# Patient Record
Sex: Male | Born: 1950 | Race: White | Hispanic: No | Marital: Married | Smoking: Current every day smoker
Health system: Southern US, Community
[De-identification: ages and names within clinical notes are randomized; demographics above are authoritative.]

## PROBLEM LIST (undated history)

## (undated) DIAGNOSIS — I739 Peripheral vascular disease, unspecified: Secondary | ICD-10-CM

## (undated) DIAGNOSIS — I10 Essential (primary) hypertension: Secondary | ICD-10-CM

## (undated) DIAGNOSIS — I2510 Atherosclerotic heart disease of native coronary artery without angina pectoris: Secondary | ICD-10-CM

## (undated) DIAGNOSIS — R609 Edema, unspecified: Secondary | ICD-10-CM

## (undated) HISTORY — PX: SPINAL FUSION: SHX223

## (undated) HISTORY — PX: BACK SURGERY: SHX140

## (undated) HISTORY — PX: STENT PLACEMENT VASCULAR (ARMC HX): HXRAD1737

## (undated) HISTORY — PX: CHOLECYSTECTOMY: SHX55

---

## 2004-05-24 ENCOUNTER — Ambulatory Visit: Payer: Self-pay | Admitting: Otolaryngology

## 2005-02-23 ENCOUNTER — Inpatient Hospital Stay: Payer: Self-pay | Admitting: Cardiovascular Disease

## 2005-02-23 ENCOUNTER — Other Ambulatory Visit: Payer: Self-pay

## 2005-02-25 ENCOUNTER — Inpatient Hospital Stay (HOSPITAL_COMMUNITY): Admission: AD | Admit: 2005-02-25 | Discharge: 2005-02-26 | Payer: Self-pay | Admitting: Cardiology

## 2005-03-15 ENCOUNTER — Encounter: Payer: Self-pay | Admitting: Cardiovascular Disease

## 2005-04-15 ENCOUNTER — Encounter: Payer: Self-pay | Admitting: Cardiovascular Disease

## 2005-05-15 ENCOUNTER — Encounter: Payer: Self-pay | Admitting: Cardiovascular Disease

## 2005-06-15 ENCOUNTER — Encounter: Payer: Self-pay | Admitting: Cardiovascular Disease

## 2006-04-19 ENCOUNTER — Observation Stay: Payer: Self-pay | Admitting: Cardiology

## 2006-04-19 ENCOUNTER — Other Ambulatory Visit: Payer: Self-pay

## 2006-04-20 ENCOUNTER — Other Ambulatory Visit: Payer: Self-pay

## 2008-03-03 ENCOUNTER — Ambulatory Visit: Payer: Self-pay | Admitting: Internal Medicine

## 2009-08-31 ENCOUNTER — Ambulatory Visit: Payer: Self-pay | Admitting: Family Medicine

## 2010-07-16 ENCOUNTER — Ambulatory Visit: Payer: Self-pay | Admitting: Otolaryngology

## 2013-05-15 ENCOUNTER — Ambulatory Visit: Payer: Self-pay | Admitting: Internal Medicine

## 2013-06-14 ENCOUNTER — Ambulatory Visit: Payer: Self-pay | Admitting: Urology

## 2014-12-30 IMAGING — US US RENAL KIDNEY
1 series · 13 of 23 positions shown · non-contrast
Comparison: none

REASON FOR EXAM: Back pain Hematuria
COMMENTS:

PROCEDURE:     ANGEL BOLIVAR - ANGEL BOLIVAR KIDNEYS  - May 15, 2013  [DATE]
RESULT:

[Series 1: us renal kidney · 0.28mm/px · 13 of 23 slices shown]
[im 1/23]
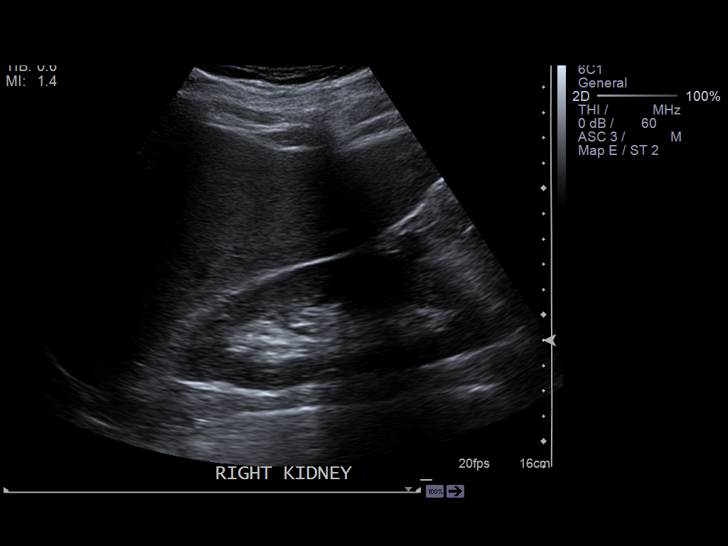
[im 3/23]
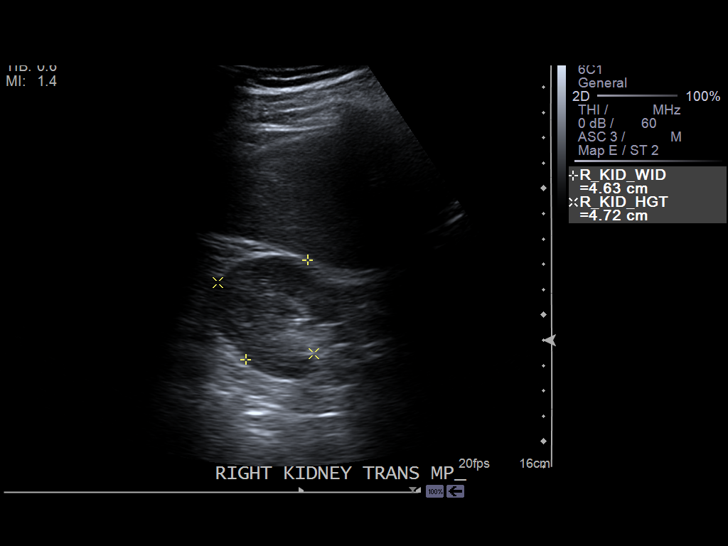
[im 5/23]
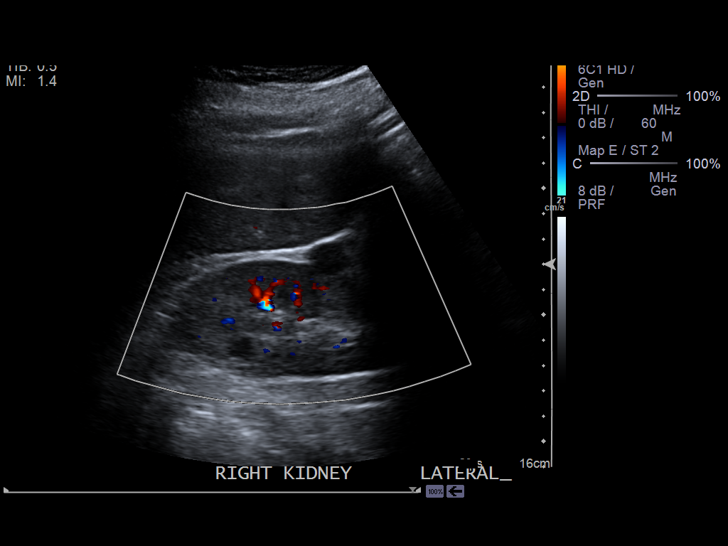
[im 7/23]
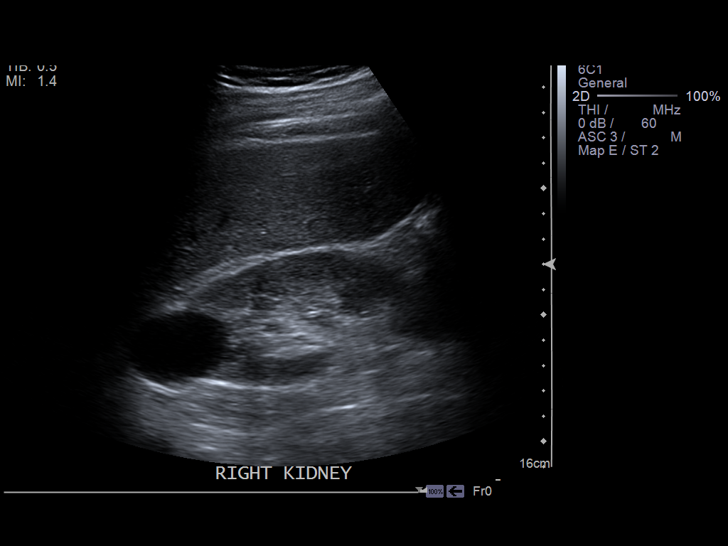
[im 8/23]
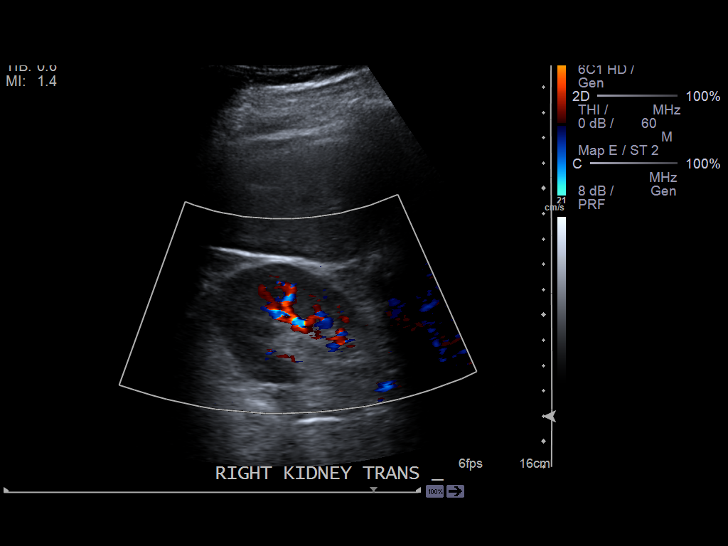
[im 10/23]
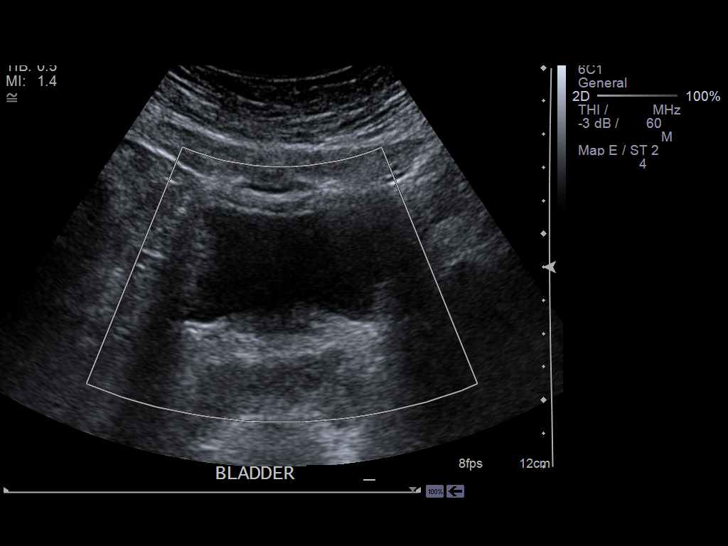
[im 12/23]
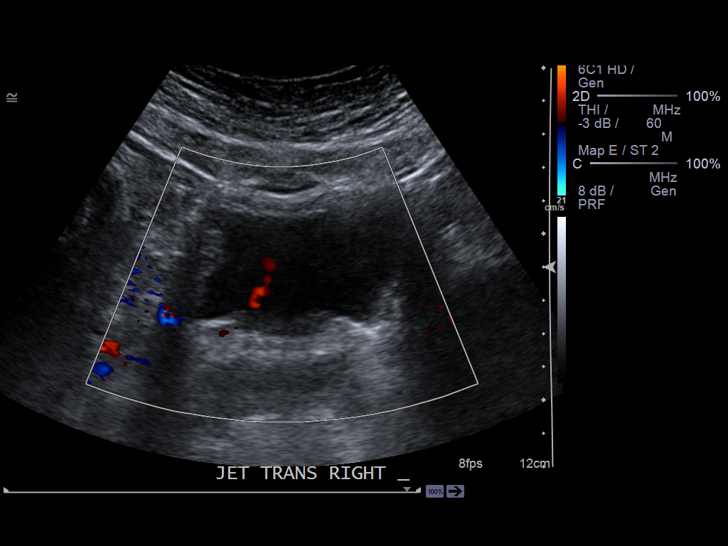
[im 14/23]
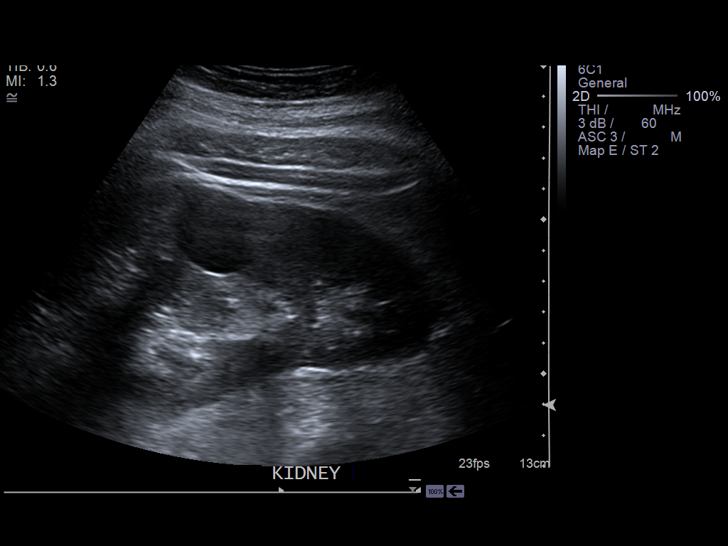
[im 16/23]
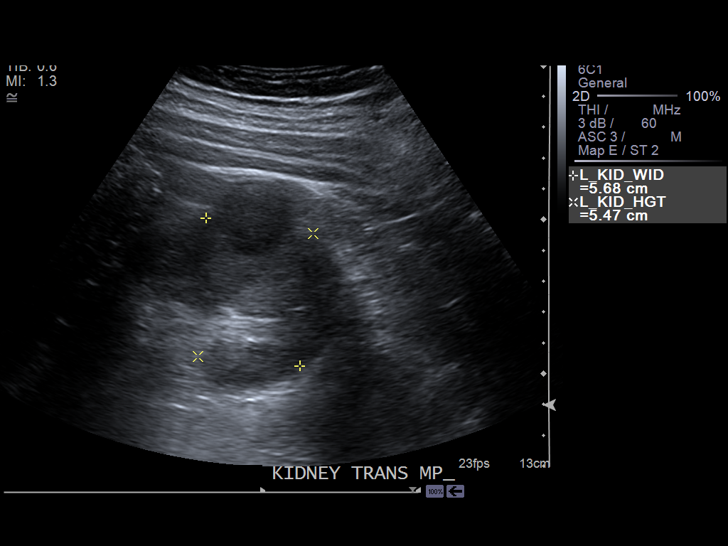
[im 17/23]
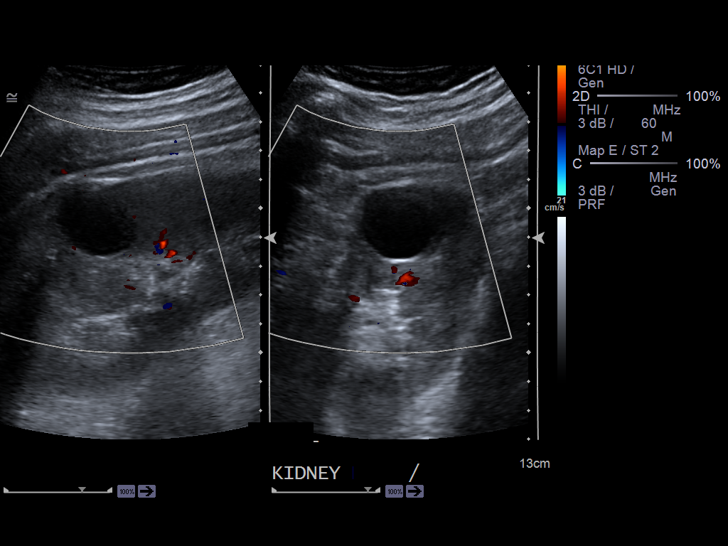
[im 19/23]
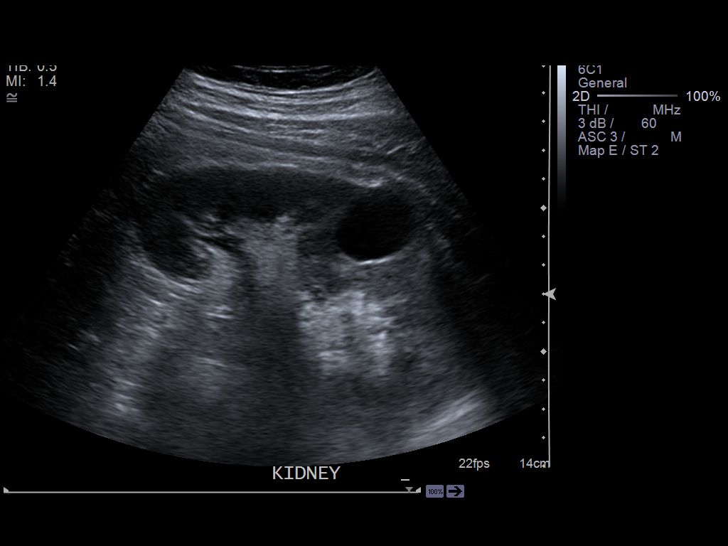
[im 21/23]
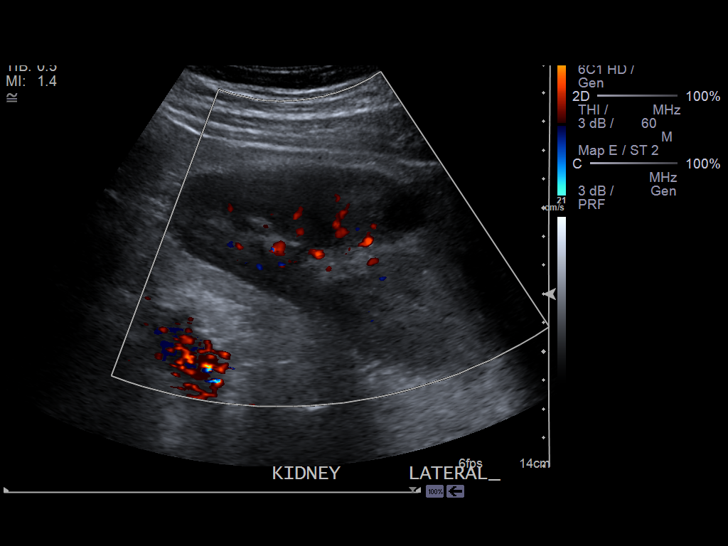
[im 23/23]
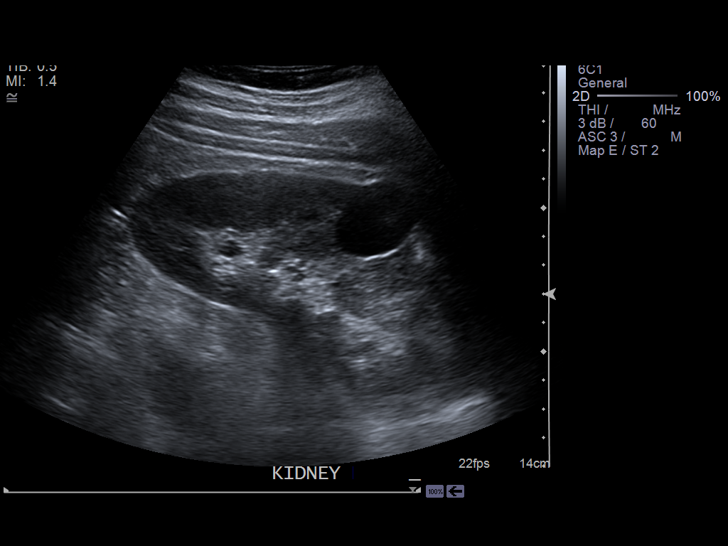

[13 of 23 positions shown; findings below may reference images not displayed]

FINDINGS: The right kidney measures 13.2 x 4.6 x 4.7 cm and the left 12.2 x
5.7 x 5.5 cm.

Evaluation of the right kidney demonstrates appropriate corticomedullary
differentiation without sonographic evidence of calculi. Three simple
appearing cysts are identified within the left kidney. The largest is in the
upper pole and appears to be within the cortex measuring 3.8 x 3.1 x 3.1 cm.
A midpole, exophytic cyst is identified measuring 1.3 x 1.5 x 1.1 cm. An
upper pole cortical cyst is identified measuring 1.2 x 1.3 x 1.1 cm. These
areas demonstrate increased through transmission, anechoic architecture and
an imperceptible wall.

Evaluation of the left kidney demonstrates appropriate corticomedullary
differentiation without evidence of calculi or hydronephrosis. A hypoechoic
mass is identified within the midpole region measuring 2.4 x 2.6 x 2.8 cm.
There is a component of increased through transmission and primary
imperceptible wall. There is no evidence of vascularity and this likely
represents a cyst. There are images which demonstrate this area as
hypoechoic and thus are indeterminate. Considering the size of this finding,
further evaluation with triphasic CT and/or MRI is recommended. Bilateral
ureteral jets are appreciated.
IMPRESSION: 1.  Simple appearing cysts within the right kidney.
2.  Likely cyst within the left kidney though the finding is indeterminate
and further evaluation recommended as described above.

## 2015-06-27 IMAGING — CT CT ABDOMEN AND PELVIS WITHOUT AND WITH CONTRAST
2 of 4 series · 14 of 32 positions shown, 19 images · non-contrast
Comparison: none

REASON FOR EXAM: Hematuria
COMMENTS:

PROCEDURE:     KCT - KCT ABDOMEN/PELVIS W/WO  - June 14, 2013  [DATE]
RESULT:     History: Hematuria.
Comparison Study: Renal ultrasound 05/15/2013.

[Series 2: wo 3.0 i40f 3 · axial · 0.83mm/px · z∈[-1141,-772]mm · 8 of 159 slices shown, 13 images]
[im 18/159  soft-tissue]
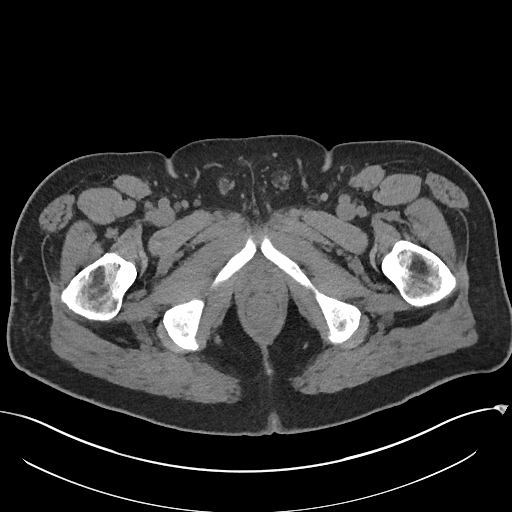
[im 18/159  bone]
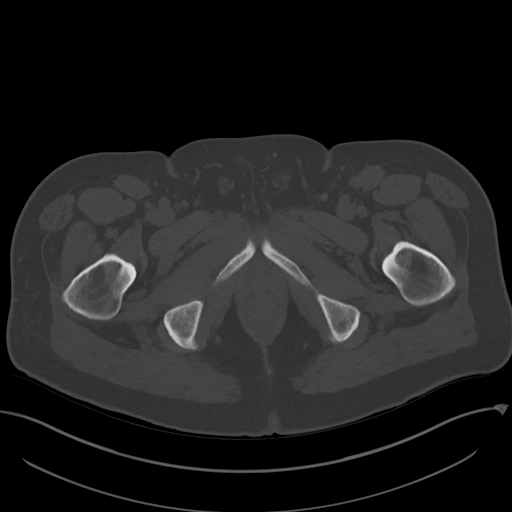
[im 36/159  soft-tissue]
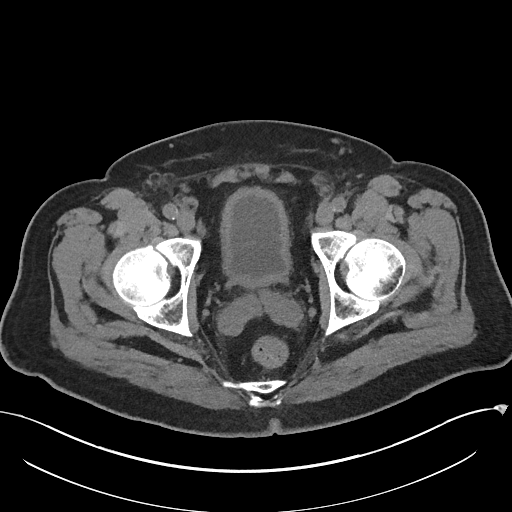
[im 53/159  soft-tissue]
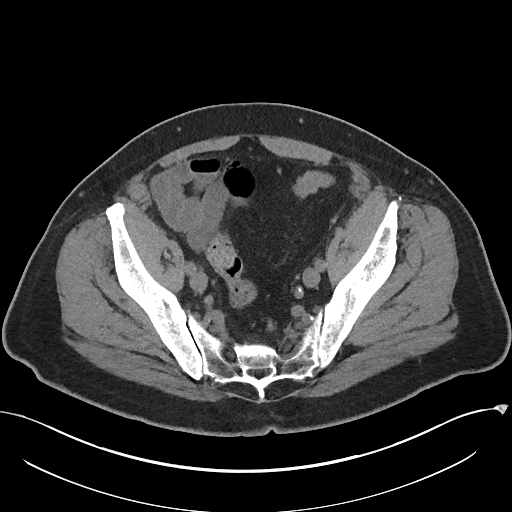
[im 71/159  soft-tissue]
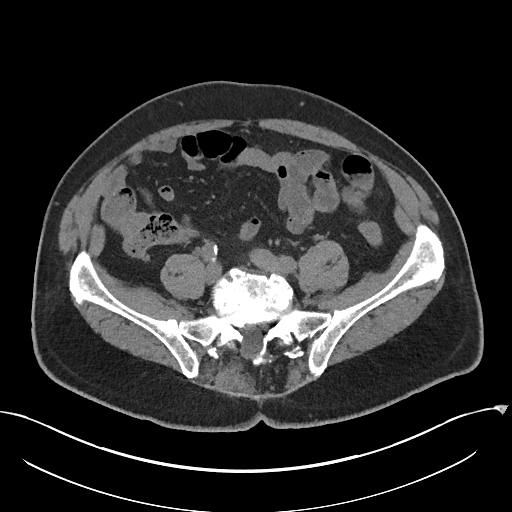
[im 88/159  soft-tissue]
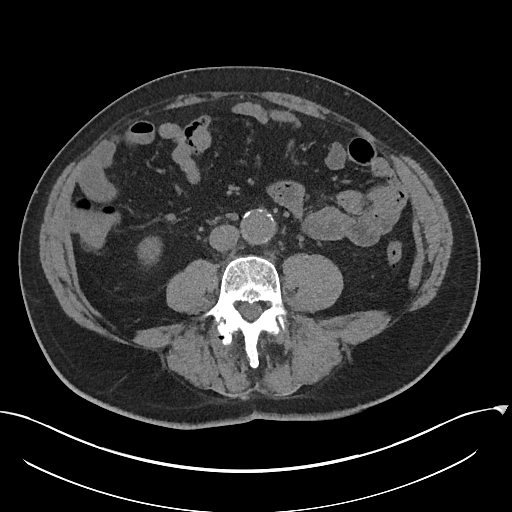
[im 88/159  lung]
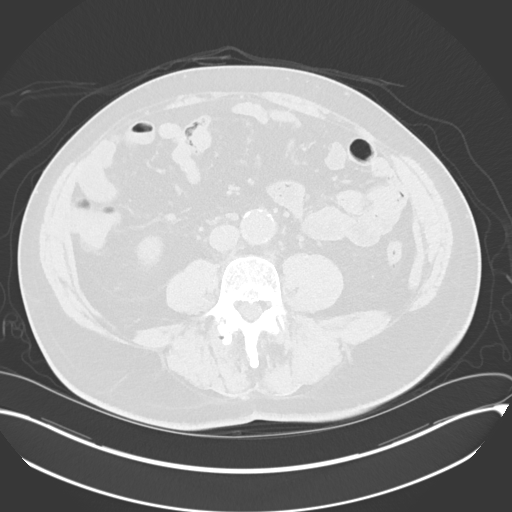
[im 106/159  soft-tissue]
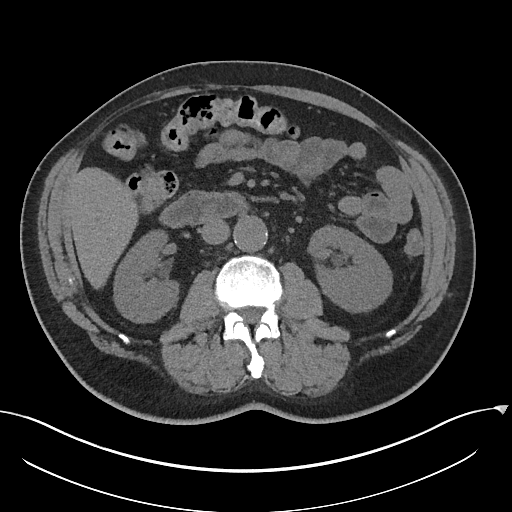
[im 106/159  lung]
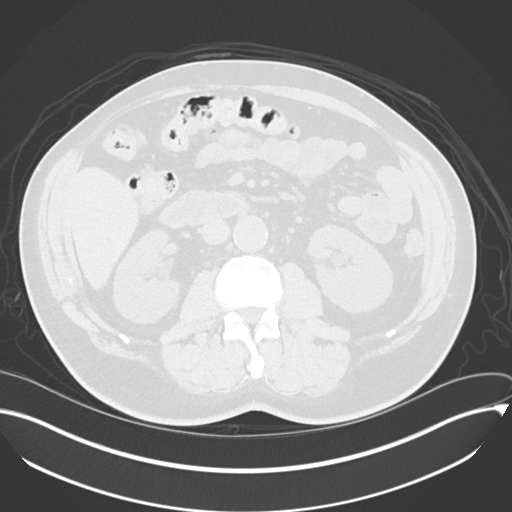
[im 123/159  soft-tissue]
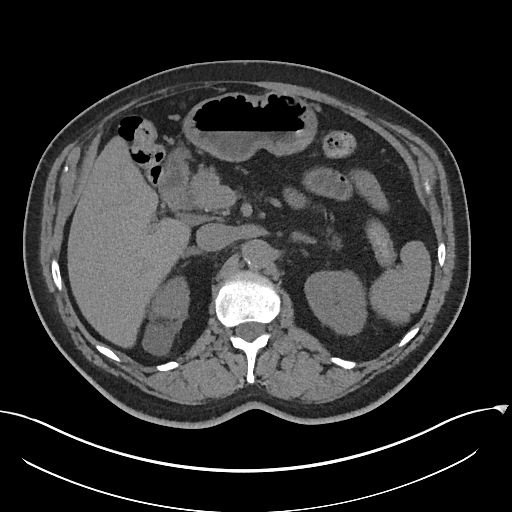
[im 123/159  lung]
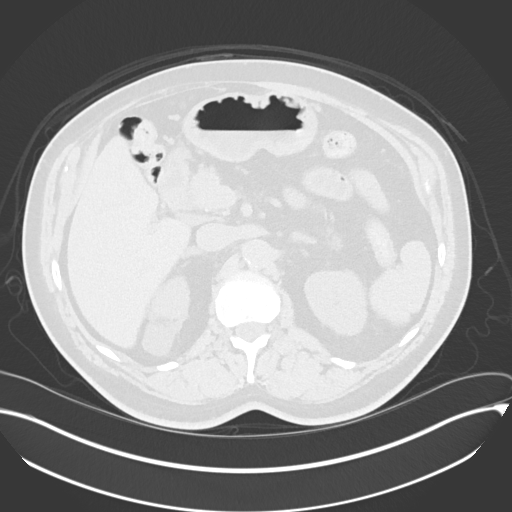
[im 141/159  soft-tissue]
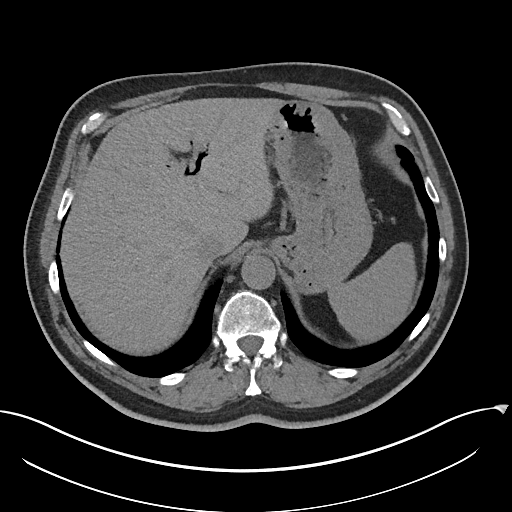
[im 141/159  lung]
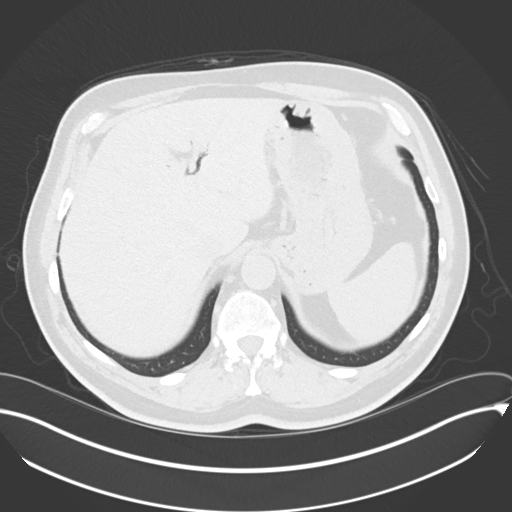

[Series 4: with 3.0 i40f 3 · axial · 0.83mm/px · z∈[-1135,-838]mm · 6 of 159 slices shown]
[im 20/159  soft-tissue]
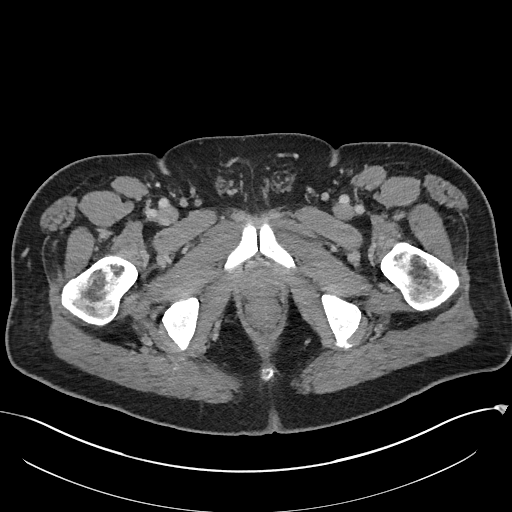
[im 40/159  soft-tissue]
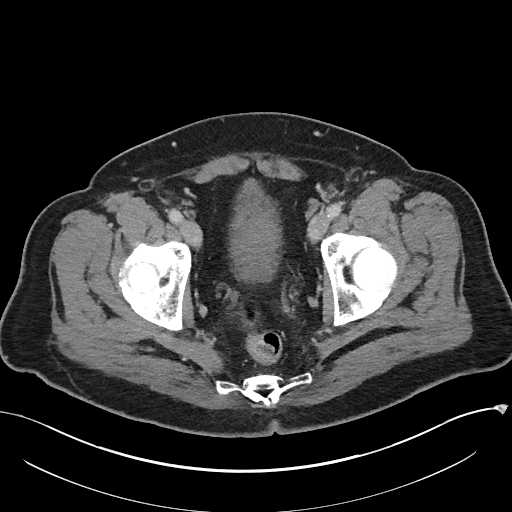
[im 60/159  soft-tissue]
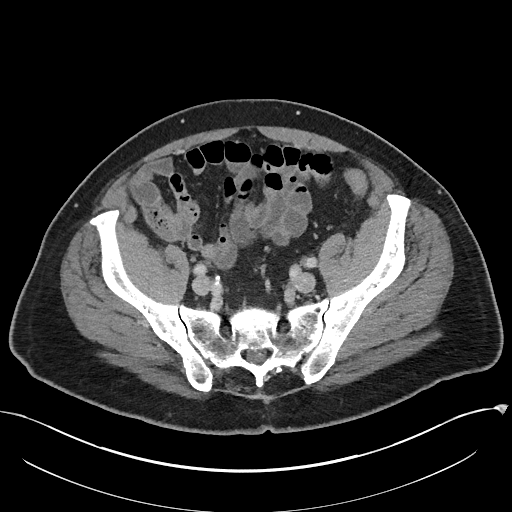
[im 80/159  soft-tissue]
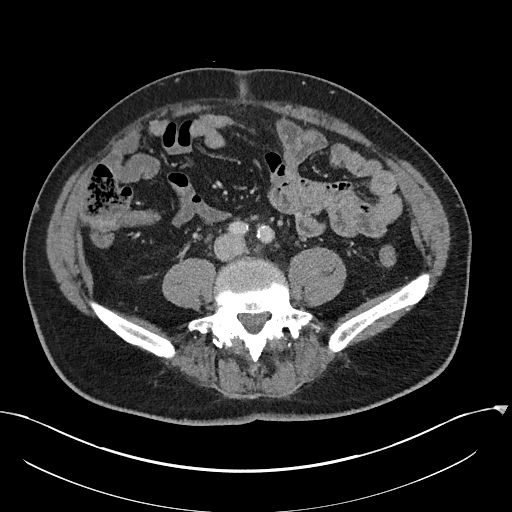
[im 99/159  soft-tissue]
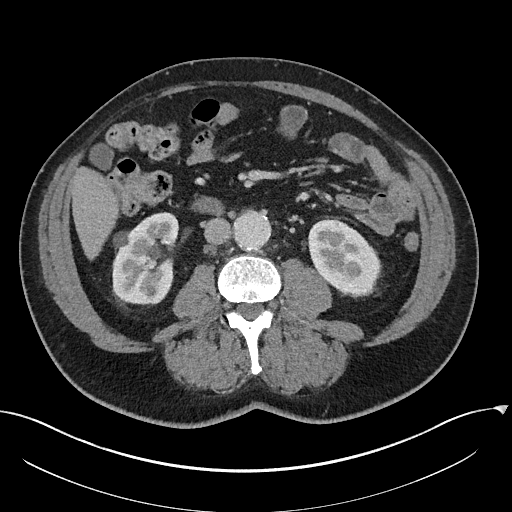
[im 119/159  soft-tissue]
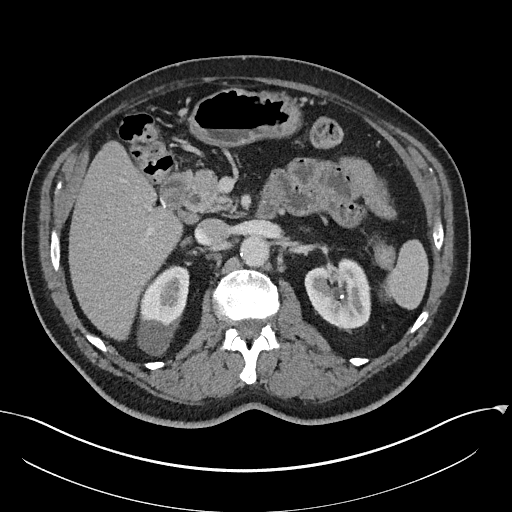

[14 of 32 positions shown; findings below may reference images not displayed]

FINDINGS: Triphasic CT obtained with 100 cc of [id number]. Evaluation in 3
dimensions on separate workstation performed. Left nephrolithiasis present.
No evidence of urolithiasis or hydronephrosis. Thickened bladder wall is
present. Cystitis and/or bladder cancer could present in this fashion.
Prostate enlargement with seminal vesicle enlargement and irregularity
noted. Prostate cancer cannot be excluded. Adrenals normal.

Prior cholecystectomy. There is air present the biliary tree. This may be
from prior biliary intervention. Common bile duct caliber is approximately 9
mm. This may be from prior cholecystectomy. No pancreatic mass is
identified. A single pancreatic calcification is identified , this could be
vascular or ductal.

No adenopathy. 3.2 cm maximum AP diameter suprailiac infrarenal abdominal
aortic aneurysm.
 Visceral including renal vessels patent.

 The appendix normal. No bowel obstruction. Diverticulosis sigmoid colon.

The lung bases clear. Heart size normal. Degenerative changes lumbar spine
and both hips.
IMPRESSION: 1. Thickening of the bladder wall. Cystitis and/or bladder cancer could
present this fashion.
2. Prostate and seminal vesicle enlargement and nodularity. Prostate
malignancy cannot be excluded.
3. 3.2 cm abdominal aortic aneurysm.

## 2015-08-24 ENCOUNTER — Emergency Department: Payer: Medicare Other

## 2015-08-24 ENCOUNTER — Inpatient Hospital Stay
Admission: EM | Admit: 2015-08-24 | Discharge: 2015-08-25 | DRG: 310 | Disposition: A | Discharge status: Home / Self Care | Payer: Medicare Other | Attending: Internal Medicine | Admitting: Internal Medicine

## 2015-08-24 ENCOUNTER — Encounter: Payer: Self-pay | Admitting: Emergency Medicine

## 2015-08-24 DIAGNOSIS — Z955 Presence of coronary angioplasty implant and graft: Secondary | ICD-10-CM

## 2015-08-24 DIAGNOSIS — E876 Hypokalemia: Secondary | ICD-10-CM | POA: Diagnosis present

## 2015-08-24 DIAGNOSIS — I472 Ventricular tachycardia: Secondary | ICD-10-CM | POA: Diagnosis not present

## 2015-08-24 DIAGNOSIS — Z981 Arthrodesis status: Secondary | ICD-10-CM

## 2015-08-24 DIAGNOSIS — H6693 Otitis media, unspecified, bilateral: Secondary | ICD-10-CM | POA: Diagnosis present

## 2015-08-24 DIAGNOSIS — I739 Peripheral vascular disease, unspecified: Secondary | ICD-10-CM | POA: Diagnosis present

## 2015-08-24 DIAGNOSIS — K219 Gastro-esophageal reflux disease without esophagitis: Secondary | ICD-10-CM | POA: Diagnosis present

## 2015-08-24 DIAGNOSIS — J449 Chronic obstructive pulmonary disease, unspecified: Secondary | ICD-10-CM | POA: Diagnosis present

## 2015-08-24 DIAGNOSIS — Z7982 Long term (current) use of aspirin: Secondary | ICD-10-CM | POA: Diagnosis not present

## 2015-08-24 DIAGNOSIS — I2510 Atherosclerotic heart disease of native coronary artery without angina pectoris: Secondary | ICD-10-CM | POA: Diagnosis present

## 2015-08-24 DIAGNOSIS — I10 Essential (primary) hypertension: Secondary | ICD-10-CM | POA: Diagnosis present

## 2015-08-24 DIAGNOSIS — F17210 Nicotine dependence, cigarettes, uncomplicated: Secondary | ICD-10-CM | POA: Diagnosis present

## 2015-08-24 DIAGNOSIS — E785 Hyperlipidemia, unspecified: Secondary | ICD-10-CM | POA: Diagnosis present

## 2015-08-24 DIAGNOSIS — I4891 Unspecified atrial fibrillation: Secondary | ICD-10-CM | POA: Diagnosis present

## 2015-08-24 DIAGNOSIS — F419 Anxiety disorder, unspecified: Secondary | ICD-10-CM | POA: Diagnosis present

## 2015-08-24 DIAGNOSIS — Z8249 Family history of ischemic heart disease and other diseases of the circulatory system: Secondary | ICD-10-CM | POA: Diagnosis not present

## 2015-08-24 DIAGNOSIS — I4729 Other ventricular tachycardia: Secondary | ICD-10-CM

## 2015-08-24 HISTORY — DX: Peripheral vascular disease, unspecified: I73.9

## 2015-08-24 HISTORY — DX: Edema, unspecified: R60.9

## 2015-08-24 HISTORY — DX: Essential (primary) hypertension: I10

## 2015-08-24 HISTORY — DX: Atherosclerotic heart disease of native coronary artery without angina pectoris: I25.10

## 2015-08-24 LAB — COMPREHENSIVE METABOLIC PANEL
ALBUMIN: 4.6 g/dL (ref 3.5–5.0)
ALT: 26 U/L (ref 17–63)
AST: 27 U/L (ref 15–41)
Alkaline Phosphatase: 149 U/L — ABNORMAL HIGH (ref 38–126)
Anion gap: 9 (ref 5–15)
BUN: 15 mg/dL (ref 6–20)
CHLORIDE: 106 mmol/L (ref 101–111)
CO2: 26 mmol/L (ref 22–32)
CREATININE: 0.74 mg/dL (ref 0.61–1.24)
Calcium: 9.4 mg/dL (ref 8.9–10.3)
GFR calc Af Amer: >60 mL/min (ref >60)
Glucose, Bld: 148 mg/dL — ABNORMAL HIGH (ref 65–99)
POTASSIUM: 3.7 mmol/L (ref 3.5–5.1)
SODIUM: 141 mmol/L (ref 135–145)
Total Bilirubin: 0.9 mg/dL (ref 0.3–1.2)
Total Protein: 7.6 g/dL (ref 6.5–8.1)

## 2015-08-24 LAB — APTT: APTT: 31 s (ref 24–36)

## 2015-08-24 LAB — CBC
HCT: 43.4 % (ref 40.0–52.0)
Hemoglobin: 14.5 g/dL (ref 13.0–18.0)
MCH: 31.4 pg (ref 26.0–34.0)
MCHC: 33.5 g/dL (ref 32.0–36.0)
MCV: 93.7 fL (ref 80.0–100.0)
PLATELETS: 177 10*3/uL (ref 150–440)
RBC: 4.63 MIL/uL (ref 4.40–5.90)
RDW: 14.6 % — AB (ref 11.5–14.5)
WBC: 5.9 10*3/uL (ref 3.8–10.6)

## 2015-08-24 LAB — PROTIME-INR
INR: 0.92
PROTHROMBIN TIME: 12.6 s (ref 11.4–15.0)

## 2015-08-24 LAB — TROPONIN I: Troponin I: <0.03 ng/mL (ref <0.031)

## 2015-08-24 LAB — LIPASE, BLOOD: LIPASE: 37 U/L (ref 11–51)

## 2015-08-24 LAB — GLUCOSE, CAPILLARY: GLUCOSE-CAPILLARY: 124 mg/dL — AB (ref 65–99)

## 2015-08-24 LAB — MAGNESIUM: Magnesium: 2 mg/dL (ref 1.7–2.4)

## 2015-08-24 MED ORDER — MAGNESIUM OXIDE 400 (241.3 MG) MG PO TABS
200.0000 mg | ORAL_TABLET | Freq: Every day | ORAL | Status: DC
  Administered 2015-08-25: 200 mg via ORAL
  Filled 2015-08-24: qty 2

## 2015-08-24 MED ORDER — ATORVASTATIN CALCIUM 20 MG PO TABS: 80.0000 mg | ORAL_TABLET | Freq: Every evening | ORAL | Status: DC

## 2015-08-24 MED ORDER — MAGNESIUM 250 MG PO TABS: 250.0000 mg | ORAL_TABLET | Freq: Every day | ORAL | Status: DC

## 2015-08-24 MED ORDER — OMEGA-3-ACID ETHYL ESTERS 1 G PO CAPS
1.0000 g | ORAL_CAPSULE | Freq: Every day | ORAL | Status: DC
  Administered 2015-08-25: 1 g via ORAL
  Filled 2015-08-24: qty 1

## 2015-08-24 MED ORDER — SODIUM CHLORIDE 0.9 % IV BOLUS (SEPSIS)
500.0000 mL | Freq: Once | INTRAVENOUS | Status: AC
  Administered 2015-08-24: 500 mL via INTRAVENOUS

## 2015-08-24 MED ORDER — SODIUM CHLORIDE 0.9 % IV SOLN
INTRAVENOUS | Status: DC
  Administered 2015-08-24: 23:00:00 via INTRAVENOUS

## 2015-08-24 MED ORDER — ASPIRIN EC 81 MG PO TBEC
81.0000 mg | DELAYED_RELEASE_TABLET | Freq: Every day | ORAL | Status: DC
  Administered 2015-08-25: 81 mg via ORAL
  Filled 2015-08-24: qty 1

## 2015-08-24 MED ORDER — ONDANSETRON HCL 4 MG PO TABS: 4.0000 mg | ORAL_TABLET | Freq: Four times a day (QID) | ORAL | Status: DC | PRN

## 2015-08-24 MED ORDER — ENOXAPARIN SODIUM 40 MG/0.4ML ~~LOC~~ SOLN
40.0000 mg | SUBCUTANEOUS | Status: DC
  Administered 2015-08-24: 40 mg via SUBCUTANEOUS
  Filled 2015-08-24 (×2): qty 0.4

## 2015-08-24 MED ORDER — AMOXICILLIN-POT CLAVULANATE 875-125 MG PO TABS
1.0000 | ORAL_TABLET | Freq: Two times a day (BID) | ORAL | Status: DC
  Administered 2015-08-25 (×2): 1 via ORAL
  Filled 2015-08-24 (×3): qty 1

## 2015-08-24 MED ORDER — MAGNESIUM OXIDE 400 (241.3 MG) MG PO TABS: 400.0000 mg | ORAL_TABLET | Freq: Every evening | ORAL | Status: DC

## 2015-08-24 MED ORDER — ONDANSETRON HCL 4 MG/2ML IJ SOLN
4.0000 mg | Freq: Four times a day (QID) | INTRAMUSCULAR | Status: DC | PRN
  Administered 2015-08-25: 4 mg via INTRAVENOUS
  Filled 2015-08-24: qty 2

## 2015-08-24 MED ORDER — ADULT MULTIVITAMIN W/MINERALS CH
1.0000 | ORAL_TABLET | Freq: Every day | ORAL | Status: DC
  Administered 2015-08-25: 1 via ORAL
  Filled 2015-08-24: qty 1

## 2015-08-24 MED ORDER — METOPROLOL TARTRATE 1 MG/ML IV SOLN
5.0000 mg | INTRAVENOUS | Status: AC
  Administered 2015-08-24: 5 mg via INTRAVENOUS

## 2015-08-24 MED ORDER — DOFETILIDE 500 MCG PO CAPS
500.0000 ug | ORAL_CAPSULE | Freq: Two times a day (BID) | ORAL | Status: DC
  Administered 2015-08-25 (×2): 500 ug via ORAL
  Filled 2015-08-24 (×4): qty 1

## 2015-08-24 MED ORDER — LOSARTAN POTASSIUM 50 MG PO TABS
100.0000 mg | ORAL_TABLET | Freq: Every day | ORAL | Status: DC
  Administered 2015-08-25: 100 mg via ORAL
  Filled 2015-08-24: qty 2

## 2015-08-24 MED ORDER — BUDESONIDE-FORMOTEROL FUMARATE 160-4.5 MCG/ACT IN AERO
2.0000 | INHALATION_SPRAY | Freq: Two times a day (BID) | RESPIRATORY_TRACT | Status: DC | PRN
  Filled 2015-08-24: qty 6

## 2015-08-24 MED ORDER — MECLIZINE HCL 25 MG PO TABS
25.0000 mg | ORAL_TABLET | Freq: Three times a day (TID) | ORAL | Status: DC | PRN
  Filled 2015-08-24: qty 1

## 2015-08-24 MED ORDER — ACETAMINOPHEN 325 MG PO TABS
650.0000 mg | ORAL_TABLET | Freq: Four times a day (QID) | ORAL | Status: DC | PRN
  Administered 2015-08-25: 650 mg via ORAL
  Filled 2015-08-24: qty 2

## 2015-08-24 MED ORDER — METOCLOPRAMIDE HCL 10 MG PO TABS: 10.0000 mg | ORAL_TABLET | Freq: Four times a day (QID) | ORAL | Status: DC | PRN

## 2015-08-24 MED ORDER — METOPROLOL TARTRATE 1 MG/ML IV SOLN
INTRAVENOUS | Status: AC
  Administered 2015-08-24: 5 mg via INTRAVENOUS
  Filled 2015-08-24: qty 5

## 2015-08-24 MED ORDER — ACETAMINOPHEN 650 MG RE SUPP: 650.0000 mg | Freq: Four times a day (QID) | RECTAL | Status: DC | PRN

## 2015-08-24 MED ORDER — METOPROLOL TARTRATE 1 MG/ML IV SOLN
2.5000 mg | INTRAVENOUS | Status: AC
  Administered 2015-08-24: 2.5 mg via INTRAVENOUS
  Filled 2015-08-24: qty 5

## 2015-08-24 MED ORDER — MAGNESIUM SULFATE 2 GM/50ML IV SOLN
INTRAVENOUS | Status: AC
  Filled 2015-08-24: qty 50

## 2015-08-24 MED ORDER — METOPROLOL TARTRATE 1 MG/ML IV SOLN: 5.0000 mg | INTRAVENOUS | Status: DC | PRN

## 2015-08-24 MED ORDER — POTASSIUM CHLORIDE 10 MEQ/100ML IV SOLN
10.0000 meq | INTRAVENOUS | Status: AC
  Administered 2015-08-24 – 2015-08-25 (×2): 10 meq via INTRAVENOUS
  Filled 2015-08-24 (×2): qty 100

## 2015-08-24 MED ORDER — MAGNESIUM SULFATE 2 GM/50ML IV SOLN
2.0000 g | Freq: Once | INTRAVENOUS | Status: AC
  Administered 2015-08-24: 2 g via INTRAVENOUS

## 2015-08-24 MED ORDER — PANTOPRAZOLE SODIUM 40 MG PO TBEC
40.0000 mg | DELAYED_RELEASE_TABLET | Freq: Every day | ORAL | Status: DC
  Administered 2015-08-25: 40 mg via ORAL
  Filled 2015-08-24: qty 1

## 2015-08-24 MED ORDER — SODIUM CHLORIDE 0.9 % IJ SOLN
3.0000 mL | Freq: Two times a day (BID) | INTRAMUSCULAR | Status: DC
  Administered 2015-08-25: 3 mL via INTRAVENOUS

## 2015-08-24 MED ORDER — METOPROLOL TARTRATE 50 MG PO TABS
50.0000 mg | ORAL_TABLET | Freq: Two times a day (BID) | ORAL | Status: DC
  Administered 2015-08-24: 50 mg via ORAL
  Filled 2015-08-24 (×2): qty 1

## 2015-08-24 NOTE — ED Provider Notes (Signed)
[facility] Emergency Department Provider Note  ____________________________________________  Time seen: Approximately 10:02 PM  I have reviewed the triage vital signs and the nursing notes.   HISTORY  Chief Complaint Chest Pain  EM caveat: Acuity of condition including active ventricular arrhythmia  HPI Shawn Frazier is a 65 y.o. male percent for evaluation of chest pain and palpitations.  Patient reports he developed 2 episodes of rapid heartbeat with associated nausea feeling is no vomit, shortness of breath and pain from his chest radiating the left arm. He also reports he tries to pass out twice and this occurred today. He currently reports he is not having any pain but he is still having occasional episodes of fluttering in his chest radiating it's very nauseated.  He is on Tikosyn.  Past Medical History  Diagnosis Date  . Hypertension   . Coronary artery disease   . Peripheral vascular disease (HCC)   . Edema     Patient Active Problem List   Diagnosis Date Noted  . Rapid atrial fibrillation (HCC) 08/24/2015    Past Surgical History  Procedure Laterality Date  . Stent placement vascular ([facility] hx)    . Cholecystectomy    . Back surgery    . Spinal fusion      Current Outpatient Rx  Name  Route  Sig  Dispense  Refill  . aspirin EC 81 MG tablet   Oral   Take 81 mg by mouth daily.         Marland Kitchen. atorvastatin (LIPITOR) 80 MG tablet   Oral   Take 80 mg by mouth every evening.         . budesonide-formoterol (SYMBICORT) 160-4.5 MCG/ACT inhaler   Inhalation   Inhale 2 puffs into the lungs 2 (two) times daily as needed (for shortness of breath).         . dofetilide (TIKOSYN) 500 MCG capsule   Oral   Take 500 mcg by mouth 2 (two) times daily.         Marland Kitchen. losartan (COZAAR) 100 MG tablet   Oral   Take 100 mg by mouth daily.         . Magnesium 250 MG TABS   Oral   Take 250 mg by mouth daily.         . magnesium oxide  (MAG-OX) 400 MG tablet   Oral   Take 400 mg by mouth every evening.         . meclizine (ANTIVERT) 25 MG tablet   Oral   Take 25 mg by mouth 3 (three) times daily as needed for dizziness.         . metoCLOPramide (REGLAN) 10 MG tablet   Oral   Take 10 mg by mouth 4 (four) times daily as needed for nausea.         . metoprolol (LOPRESSOR) 50 MG tablet   Oral   Take 50 mg by mouth 2 (two) times daily.         . Multiple Vitamin (MULTIVITAMIN WITH MINERALS) TABS tablet   Oral   Take 1 tablet by mouth daily.         . Omega-3 Fatty Acids (FISH OIL) 500 MG CAPS   Oral   Take 500 mg by mouth daily.         Marland Kitchen. omeprazole (PRILOSEC) 20 MG capsule   Oral   Take 20 mg by mouth daily.  Allergies Shellfish allergy; Codeine; Morphine and related; Motrin; and Xarelto  Family History  Problem Relation Age of Onset  . Heart disease Mother   . Heart disease Father     Social History Social History  Substance Use Topics  . Smoking status: Current Every Day Smoker -- 1.00 packs/day    Types: Cigarettes  . Smokeless tobacco: None  . Alcohol Use: No    Review of Systems Constitutional: No fever/chills Eyes: No visual changes. ENT: No sore throat. Cardiovascular: See history of present illness Respiratory: See history of present illness  Gastrointestinal: No abdominal pain.  No nausea, no vomiting.  No diarrhea.  No constipation. Genitourinary: Negative for dysuria. Musculoskeletal: Negative for back pain. Skin: Negative for rash. Neurological: Negative for headaches, focal weakness or numbness.  10-point ROS otherwise negative.  ____________________________________________   PHYSICAL EXAM:  VITAL SIGNS: ED Triage Vitals  Enc Vitals Group     BP 08/24/15 2000 121/77 mmHg     Pulse Rate 08/24/15 2000 38     Resp 08/24/15 2000 14     Temp --      Temp src --      SpO2 08/24/15 2000 93 %     Weight 08/24/15 2026 212 lb (96.163 kg)     Height  08/24/15 2026 6\' 2"  (1.88 m)     Head Cir --      Peak Flow --      Pain Score --      Pain Loc --      Pain Edu? --      Excl. in GC? --    Constitutional: Alert and oriented. Slightly pale and diaphoretic. Patient looks immediately L Eyes: Conjunctivae are normal. PERRL. EOMI. Head: Atraumatic. Nose: No congestion/rhinnorhea. Mouth/Throat: Mucous membranes are moist.  Oropharynx non-erythematous. Neck: No stridor.  Cardiovascular: Very tachycardic rate, up to almost 200 with irregularity. Patient is observed to have a rapid episode of what appears to be brief polymorphic ventricular tachycardia.  normal heart sounds.  Good peripheral circulation. Respiratory: Normal respiratory effort.  No retractions. Lungs CTAB. Gastrointestinal: Soft and nontender. No distention. No abdominal bruits. No CVA tenderness. Musculoskeletal: No lower extremity tenderness nor edema.  No joint effusions. Neurologic:  Normal speech and language. No gross focal neurologic deficits are appreciated. Skin:  Skin is warm, dry and intact. No rash noted. Psychiatric: Mood and affect are somewhat anxious. ____________________________________________   LABS (all labs ordered are listed, but only abnormal results are displayed)  Labs Reviewed  CBC - Abnormal; Notable for the following:    RDW 14.6 (*)    All other components within normal limits  COMPREHENSIVE METABOLIC PANEL - Abnormal; Notable for the following:    Glucose, Bld 148 (*)    Alkaline Phosphatase 149 (*)    All other components within normal limits  LIPASE, BLOOD  MAGNESIUM  TROPONIN I  PROTIME-INR  APTT  BASIC METABOLIC PANEL  CBC  TROPONIN I  TROPONIN I  LIPID PANEL  TSH   ____________________________________________  EKG  Patient's EKG is reviewed and interpreted by me at 1940 hrs. Heart rate 170 QRS 90 QTC 500 Diffuse ST depressions are noted, irregular rhythm presented to be atrial fibrillation Reviewed and interpreted as  atrial fibrillation with rapid ventricular response with possible subendocardial versus rate-related ischemic changes and no ST elevation ____________________________________________  RADIOLOGY  DG Chest Port 1 View (Final result) Result time: 08/24/15 20:31:06   Final result by Rad Results In Interface (08/24/15 20:31:06)  Narrative:   CLINICAL DATA: Acute chest pain.  EXAM: PORTABLE CHEST 1 VIEW  COMPARISON: April 19, 2006.  FINDINGS: The heart size and mediastinal contours are within normal limits. Both lungs are clear. No pneumothorax or pleural effusion is noted. The visualized skeletal structures are unremarkable.  IMPRESSION: No acute cardiopulmonary abnormality seen.   Electronically Signed By: Lupita Raider, M.D. On: 08/24/2015 20:31       ____________________________________________   PROCEDURES  Procedure(s) performed: None  Critical Care performed: Yes, see critical care note(s)  CRITICAL CARE Performed by: Sharyn Creamer   Total critical care time: 55 minutes  Critical care time was exclusive of separately billable procedures and treating other patients.  Critical care was necessary to treat or prevent imminent or life-threatening deterioration.  Critical care was time spent personally by me on the following activities: development of treatment plan with patient and/or surrogate as well as nursing, discussions with consultants, evaluation of patient's response to treatment, examination of patient, obtaining history from patient or surrogate, ordering and performing treatments and interventions, ordering and review of laboratory studies, ordering and review of radiographic studies, pulse oximetry and re-evaluation of patient's condition.  Time includes to phone consultations with Dr. Juliann Pares of cardiology, also called with consultation to [facility] and a transfer request for polymorphic ventricular tachycardia. The patient was seen  and evaluated immediately for life-threatening arrhythmia. He was treated aggressively with IV magnesium, all further repletion and Lopressor IV with good effect. In review of his rhythm strips does indicate a possible episode of polymorphic ventricular tachycardia. ____________________________________________   INITIAL IMPRESSION / ASSESSMENT AND PLAN / ED COURSE  Pertinent labs & imaging results that were available during my care of the patient were reviewed by me and considered in my medical decision making (see chart for details).  Patient presents with extreme tachycardia likely with A. fib RVR with also possible episode of polymorphic V. Tach.  Discussed with cardiology advised history of fluids, magnesium, Lopressor as blood pressure tolerates.  ----------------------------------------- 10:05 PM on 08/24/2015 -----------------------------------------  Patient heart rate and vital signs much improved at this time. Appears Lopressor was very effective along with magnesium electrolyte patient. He is overall much improved. [facility] cardiac I see is currently full, we will admit the patient here for ongoing care and management. Original plan was to transfer the patient for immediate electrophysiology evaluation for arrhythmia management, however 1 symptoms improve discussed with Dr. Juliann Pares who advises admission here and possible transfer to [facility] if required later in the evening. Patient agreeable with plan. ____________________________________________   FINAL CLINICAL IMPRESSION(S) / ED DIAGNOSES  Final diagnoses:  Polymorphic ventricular tachycardia (HCC)  Atrial fibrillation, rapid (HCC)      Sharyn Creamer, MD 08/24/15 2206

## 2015-08-24 NOTE — ED Notes (Signed)
Pt in via EMS w/ complaints of chest pain and near syncope episode at home.  Pt felt like "my heart was about to jump out of my chest"  Pt HR in 180's upon arrival w/ run of Vtach.  MD at bedside.  Pt placed on zoll monitor.  Pt A/Ox4 at this time, chest pain has resolved.

## 2015-08-24 NOTE — H&P (Signed)
[facility] [place] [facility]   PATIENT NAME: Shawn Frazier    MR#:  [id number]  DATE OF BIRTH:  02/14/1951  DATE OF ADMISSION:  08/24/2015  PRIMARY CARE PHYSICIAN: [facility] Venia Minks  REQUESTING/REFERRING PHYSICIAN:   CHIEF COMPLAINT:   Chief Complaint  Patient presents with  . Chest Pain    HISTORY OF PRESENT ILLNESS:  Shawn Frazier  is a 65 y.o. male with a known history of atrial fibrillation. He presents to the ER with severe fluttering in his chest and really rapid heart rate. He had a bad pain in his left arm and was short of breath and he thought he was having a heart attack. Normally with his atrial fibrillation he lies down and tries to wait it out and it gets better on its own. Today he almost passed out 2 times. He is feeling a little nauseous. No complaints of chest pain. In the ER, he was found to be in rapid atrial fibrillation up to 170 bpm. The ER physician thought he may have seen ventricular tachycardia. Hospitalist services were contacted for further evaluation.  PAST MEDICAL HISTORY:   Past Medical History  Diagnosis Date  . Hypertension   . Coronary artery disease   . Peripheral vascular disease (HCC)   . Edema     PAST SURGICAL HISTORY:   Past Surgical History  Procedure Laterality Date  . Stent placement vascular ([facility] hx)    . Cholecystectomy    . Back surgery    . Spinal fusion      SOCIAL HISTORY:   Social History  Substance Use Topics  . Smoking status: Current Every Day Smoker -- 1.00 packs/day    Types: Cigarettes  . Smokeless tobacco: Not on file  . Alcohol Use: No    FAMILY HISTORY:   Family History  Problem Relation Age of Onset  . Heart disease Mother   . Heart disease Father     DRUG ALLERGIES:   Allergies  Allergen Reactions  . Shellfish Allergy Anaphylaxis  . Codeine Nausea And Vomiting  . Morphine And Related Nausea And Vomiting  . Motrin [Ibuprofen] Nausea And Vomiting  .  Xarelto [Rivaroxaban] Other (See Comments)    Pt states that he urinated blood while on this medication.      REVIEW OF SYSTEMS:  CONSTITUTIONAL: No fever, positive for fatigue. Positive for weight loss from 258 down to 212 once he stopped Zoloft. EYES: No blurred or double vision. He wears glasses EARS, NOSE, AND THROAT: No tinnitus or ear pain. No sore throat. Positive for runny nose RESPIRATORY: Occasional cough, positive for shortness of breath, no wheezing or hemoptysis.  CARDIOVASCULAR: No chest pain, positive for edema.  GASTROINTESTINAL: Positive for nausea, no vomiting, diarrhea or abdominal pain. No blood in bowel movements GENITOURINARY: No dysuria, previous blood in the urine when he was on Xarelto  ENDOCRINE: No polyuria, nocturia,  HEMATOLOGY: No anemia, easy bruising or bleeding SKIN: No rash or lesion. MUSCULOSKELETAL: No joint pain or arthritis.   NEUROLOGIC: Near syncope PSYCHIATRY: No anxiety or depression.   MEDICATIONS AT HOME:   Prior to Admission medications   Medication Sig Start Date End Date Taking? Authorizing Provider  aspirin EC 81 MG tablet Take 81 mg by mouth daily.   Yes Historical Provider, MD  atorvastatin (LIPITOR) 80 MG tablet Take 80 mg by mouth every evening.   Yes Historical Provider, MD  budesonide-formoterol (SYMBICORT) 160-4.5 MCG/ACT inhaler Inhale 2 puffs into the  lungs 2 (two) times daily as needed (for shortness of breath).   Yes Historical Provider, MD  dofetilide (TIKOSYN) 500 MCG capsule Take 500 mcg by mouth 2 (two) times daily.   Yes Historical Provider, MD  losartan (COZAAR) 100 MG tablet Take 100 mg by mouth daily.   Yes Historical Provider, MD  Magnesium 250 MG TABS Take 250 mg by mouth daily.   Yes Historical Provider, MD  magnesium oxide (MAG-OX) 400 MG tablet Take 400 mg by mouth every evening.   Yes Historical Provider, MD  meclizine (ANTIVERT) 25 MG tablet Take 25 mg by mouth 3 (three) times daily as needed for dizziness.   Yes  Historical Provider, MD  metoCLOPramide (REGLAN) 10 MG tablet Take 10 mg by mouth 4 (four) times daily as needed for nausea.   Yes Historical Provider, MD  metoprolol (LOPRESSOR) 50 MG tablet Take 50 mg by mouth 2 (two) times daily.   Yes Historical Provider, MD  Multiple Vitamin (MULTIVITAMIN WITH MINERALS) TABS tablet Take 1 tablet by mouth daily.   Yes Historical Provider, MD  Omega-3 Fatty Acids (FISH OIL) 500 MG CAPS Take 500 mg by mouth daily.   Yes Historical Provider, MD  omeprazole (PRILOSEC) 20 MG capsule Take 20 mg by mouth daily.   Yes Historical Provider, MD      VITAL SIGNS:  Blood pressure 115/79, pulse 87, resp. rate 18, height 6\' 2"  (1.88 m), weight 96.163 kg (212 lb), SpO2 97 %. Patient's actual pulse at the time of admission was 130 not 87. PHYSICAL EXAMINATION:  GENERAL:  65 y.o.-year-old patient lying in the bed with no acute distress.  EYES: Pupils equal, round, reactive to light and accommodation. No scleral icterus. Extraocular muscles intact.  HEENT: Head atraumatic, normocephalic. Oropharynx and nasopharynx clear. Tympanic membrane bilaterally erythematous and bulging.  NECK:  Supple, no jugular venous distention. No thyroid enlargement, no tenderness.  LUNGS: Normal breath sounds bilaterally, no wheezing, rales,rhonchi or crepitation. No use of accessory muscles of respiration.  CARDIOVASCULAR: S1, S2 irregularly irregular tachycardic. No murmurs, rubs, or gallops.  ABDOMEN: Soft, nontender, nondistended. Bowel sounds present. No organomegaly or mass.  EXTREMITIES: 2+ edema, no cyanosis, or clubbing.  NEUROLOGIC: Cranial nerves II through XII are intact. Muscle strength 5/5 in all extremities. Sensation intact. Gait not checked.  PSYCHIATRIC: The patient is alert and oriented x 3.  SKIN: No rash, lesion, or ulcer.   LABORATORY PANEL:   CBC  Recent Labs Lab 08/24/15 1942  WBC 5.9  HGB 14.5  HCT 43.4  PLT 177    ------------------------------------------------------------------------------------------------------------------  Chemistries   Recent Labs Lab 08/24/15 1942  NA 141  K 3.7  CL 106  CO2 26  GLUCOSE 148*  BUN 15  CREATININE 0.74  CALCIUM 9.4  MG 2.0  AST 27  ALT 26  ALKPHOS 149*  BILITOT 0.9   ------------------------------------------------------------------------------------------------------------------  Cardiac Enzymes  Recent Labs Lab 08/24/15 1942  TROPONINI <0.03   ------------------------------------------------------------------------------------------------------------------  RADIOLOGY:  Dg Chest Port 1 View  08/24/2015  CLINICAL DATA:  Acute chest pain. EXAM: PORTABLE CHEST 1 VIEW COMPARISON:  April 19, 2006. FINDINGS: The heart size and mediastinal contours are within normal limits. Both lungs are clear. No pneumothorax or pleural effusion is noted. The visualized skeletal structures are unremarkable. IMPRESSION: No acute cardiopulmonary abnormality seen. Electronically Signed   By: Lupita RaiderJames  Green Jr, M.D.   On: 08/24/2015 20:31    EKG:   Atrial fibrillation 170 bpm  IMPRESSION AND PLAN:   1.  Rapid atrial fibrillation with shortness of breath and left arm pain and near syncope. Now he's feeling better with his heart rate in the 09/04/1928 range. I will give his usual Tikosyn dose which she is overdue for. I will give his normal oral metoprolol. When necessary IV metoprolol for heart rate greater than 140. May have to increase overall metoprolol dose. He is on aspirin only for anticoagulation because he had bleeding with Xarelto. ER physician thought he saw ventricular tachycardia on the monitor and he gave IV magnesium. I'm not so sure this is ventricular tachycardia and I will have the cardiologist's review the strips tomorrow. Obtain an echocardiogram. 2. Otitis media bilaterally- start Augmentin 3. Essential hypertension- Continue usual medications 4.  Hyperlipidemia unspecified continue atorvastatin 5. Gastroesophageal reflux disease without esophagitis on omeprazole as outpatient will continue PPI here 6. Tobacco abuse- smoking cessation counseling done 3 minutes by me. Refused nicotine patch.  All the records are reviewed and case discussed with ED provider. Management plans discussed with the patient, family and they are in agreement.  CODE STATUS: Full code  TOTAL TIME TAKING CARE OF THIS PATIENT: 50  minutes.    Alford Highland M.D on 08/24/2015 at 9:25 PM  Between 7am to 6pm - Pager - [phone]  After 6pm call admission pager [phone]  [facility]  Office  [phone]  CC: Primary care physician; No primary care provider on file.

## 2015-08-25 ENCOUNTER — Inpatient Hospital Stay
Admit: 2015-08-25 | Discharge: 2015-08-25 | Disposition: A | Discharge status: Home / Self Care | Payer: Medicare Other | Attending: Internal Medicine | Admitting: Internal Medicine

## 2015-08-25 LAB — BASIC METABOLIC PANEL
ANION GAP: 3 — AB (ref 5–15)
BUN: 14 mg/dL (ref 6–20)
CALCIUM: 8.3 mg/dL — AB (ref 8.9–10.3)
CO2: 24 mmol/L (ref 22–32)
CREATININE: 0.6 mg/dL — AB (ref 0.61–1.24)
Chloride: 113 mmol/L — ABNORMAL HIGH (ref 101–111)
Glucose, Bld: 96 mg/dL (ref 65–99)
Potassium: 4.1 mmol/L (ref 3.5–5.1)
SODIUM: 140 mmol/L (ref 135–145)

## 2015-08-25 LAB — CBC
HCT: 37 % — ABNORMAL LOW (ref 40.0–52.0)
HEMOGLOBIN: 12.3 g/dL — AB (ref 13.0–18.0)
MCH: 30.7 pg (ref 26.0–34.0)
MCHC: 33.3 g/dL (ref 32.0–36.0)
MCV: 92.2 fL (ref 80.0–100.0)
PLATELETS: 155 10*3/uL (ref 150–440)
RBC: 4.01 MIL/uL — AB (ref 4.40–5.90)
RDW: 14.1 % (ref 11.5–14.5)
WBC: 4.4 10*3/uL (ref 3.8–10.6)

## 2015-08-25 LAB — TROPONIN I

## 2015-08-25 LAB — TSH: TSH: 2.285 u[IU]/mL (ref 0.350–4.500)

## 2015-08-25 LAB — LIPID PANEL
CHOL/HDL RATIO: 3.2 ratio
Cholesterol: 97 mg/dL (ref 0–200)
HDL: 30 mg/dL — AB (ref >40)
LDL Cholesterol: 49 mg/dL (ref 0–99)
Triglycerides: 91 mg/dL (ref <150)
VLDL: 18 mg/dL (ref 0–40)

## 2015-08-25 LAB — MRSA PCR SCREENING: MRSA BY PCR: NEGATIVE

## 2015-08-25 MED ORDER — AMOXICILLIN-POT CLAVULANATE 875-125 MG PO TABS: 1.0000 | ORAL_TABLET | Freq: Two times a day (BID) | ORAL | Status: DC

## 2015-08-25 MED ORDER — METOPROLOL TARTRATE 75 MG PO TABS: 75.0000 mg | ORAL_TABLET | Freq: Two times a day (BID) | ORAL | Status: DC

## 2015-08-25 MED ORDER — METOPROLOL TARTRATE 50 MG PO TABS
75.0000 mg | ORAL_TABLET | Freq: Two times a day (BID) | ORAL | Status: DC
  Administered 2015-08-25: 75 mg via ORAL
  Filled 2015-08-25: qty 1

## 2015-08-25 NOTE — Discharge Summary (Signed)
Shawn RevelsDonald R Frazier, is a 65 y.o. male  DOB 08/26/1950  MRN [id number].  Admission date:  08/24/2015  Admitting Physician  Alford Highlandichard Wieting, MD  Discharge Date:  08/25/2015   Primary MD  No primary care provider on file.  Recommendations for primary care physician for things to follow:  Follow-up Dr. Gwen PoundsKowalski in 1 week.   Admission Diagnosis  Polymorphic ventricular tachycardia (HCC) [I47.2] Atrial fibrillation, rapid (HCC) [I48.91]   Discharge Diagnosis  Polymorphic ventricular tachycardia (HCC) [I47.2] Atrial fibrillation, rapid (HCC) [I48.91]    Active Problems:   Rapid atrial fibrillation [facility](HCC)      Past Medical History  Diagnosis Date  . Hypertension   . Coronary artery disease   . Peripheral vascular disease (HCC)   . Edema     Past Surgical History  Procedure Laterality Date  . Stent placement vascular ([facility] hx)    . Cholecystectomy    . Back surgery    . Spinal fusion         History of present illness and  Hospital Course:     Kindly see H&P for history of present illness and admission details, please review complete Labs, Consult reports and Test reports for all details in brief  HPI  from the history and physical done on the day of admission  65 year old male patient with history of atrial fibrillation came to the emergency room because of this fluttering in the chest, found to have rapid A. fib with RVR,  170 bpm.  [facility]\  Hospital Course  1 atrial fibrillation with RVR: Admitted to intensive care unit. Patient had the left arm pain, near syncope. Patient usually takes the Tikosyn, metoprolol. We continued them. Never had to go on Cardizem drip. Cardiology saw the patient today morning.  Heart Rate stayed around 80-90 with atrial fibrillation. They have included metoprolol to 75 mg by mouth twice a  day, continue Tikosyn. Aspirin alone for anti-coagulation because of bleeding. With xarelto.Patient ambulated without any further tachycardia. Cardiology felt that patient can be discharged, follow up with Dr. . Gwen PoundsKowalski as an outpatient. Patient alsofollow-up with the EP physician as an outpatient. Patient advised to keep potassium above 4,. On Admission potassium was 3.7.  #2 bilateral otitis media causing vertigo: Patient started on Augmentin. Discharge home with same.  #3.h/o  COPD: no wheezing. Patient advised to continue Symbicort. #4 hypertension controlled continue Cozaar,  metoprolol. #5 GERD continue PPIs. Discharge Condition: stable   Follow UP  Follow-up Information    Follow up with Lamar BlinksKOWALSKI,BRUCE J, MD In 1 week.   Specialty:  Internal Medicine   Why:  January 18th at 9:00am   Contact information:   [address] [facility] [place] [id number] [phone]         Discharge Instructions  and  Discharge Medications        Medication List    TAKE these medications        amoxicillin-clavulanate 875-125 MG tablet  Commonly known as:  AUGMENTIN  Take 1 tablet by mouth every 12 (twelve) hours.     aspirin EC 81 MG tablet  Take 81 mg by mouth daily.     atorvastatin 80 MG tablet  Commonly known as:  LIPITOR  Take 80 mg by mouth every evening.     budesonide-formoterol 160-4.5 MCG/ACT inhaler  Commonly known as:  SYMBICORT  Inhale 2 puffs into the lungs 2 (two) times daily as needed (for shortness of breath).  dofetilide 500 MCG capsule  Commonly known as:  TIKOSYN  Take 500 mcg by mouth 2 (two) times daily.     Fish Oil 500 MG Caps  Take 500 mg by mouth daily.     losartan 100 MG tablet  Commonly known as:  COZAAR  Take 100 mg by mouth daily.     Magnesium 250 MG Tabs  Take 250 mg by mouth daily.     magnesium oxide 400 MG tablet  Commonly known as:  MAG-OX  Take 400 mg by mouth every evening.     meclizine  25 MG tablet  Commonly known as:  ANTIVERT  Take 25 mg by mouth 3 (three) times daily as needed for dizziness.     metoCLOPramide 10 MG tablet  Commonly known as:  REGLAN  Take 10 mg by mouth 4 (four) times daily as needed for nausea.     Metoprolol Tartrate 75 MG Tabs  Take 75 mg by mouth 2 (two) times daily.     multivitamin with minerals Tabs tablet  Take 1 tablet by mouth daily.     omeprazole 20 MG capsule  Commonly known as:  PRILOSEC  Take 20 mg by mouth daily.          Diet and Activity recommendation: See Discharge Instructions above   Consults obtained -cardio   Major procedures and Radiology Reports - PLEASE review detailed and final reports for all details, in brief -     Dg Chest Port 1 View  08/24/2015  CLINICAL DATA:  Acute chest pain. EXAM: PORTABLE CHEST 1 VIEW COMPARISON:  April 19, 2006. FINDINGS: The heart size and mediastinal contours are within normal limits. Both lungs are clear. No pneumothorax or pleural effusion is noted. The visualized skeletal structures are unremarkable. IMPRESSION: No acute cardiopulmonary abnormality seen. Electronically Signed   By: Lupita Raider, M.D.   On: 08/24/2015 20:31    Micro Results    Recent Results (from the past 240 hour(s))  MRSA PCR Screening     Status: None   Collection Time: 08/24/15 11:15 PM  Result Value Ref Range Status   MRSA by PCR NEGATIVE NEGATIVE Final    Comment:        The GeneXpert MRSA Assay (FDA approved for NASAL specimens only), is one component of a comprehensive MRSA colonization surveillance program. It is not intended to diagnose MRSA infection nor to guide or monitor treatment for MRSA infections.        Today   Subjective:   Shawn Frazier today has no headache,no chest abdominal pain,no new weakness tingling or numbness, feels much better wants to go home today.   Objective:   Blood pressure 116/63, pulse 57, temperature 98.7 F (37.1 C), temperature source  Oral, resp. rate 15, height 6\' 2"  (1.88 m), weight 94.62 kg (208 lb 9.6 oz), SpO2 97 %.   Intake/Output Summary (Last 24 hours) at 08/25/15 1508 Last data filed at 08/25/15 1300  Gross per 24 hour  Intake 581.67 ml  Output    600 ml  Net -18.33 ml    Exam Awake Alert, Oriented x 3, No new F.N deficits, Normal affect [place].AT,PERRAL Supple Neck,No JVD, No cervical lymphadenopathy appriciated.  Symmetrical Chest wall movement, Good air movement bilaterally, CTAB RRR,No Gallops,Rubs or new Murmurs, No Parasternal Heave +ve B.Sounds, Abd Soft, Non tender, No organomegaly appriciated, No rebound -guarding or rigidity. No Cyanosis, Clubbing or edema, No new Rash or bruise  Data Review   CBC  w Diff: Lab Results  Component Value Date   WBC 4.4 08/25/2015   HGB 12.3* 08/25/2015   HCT 37.0* 08/25/2015   PLT 155 08/25/2015    CMP: Lab Results  Component Value Date   NA 140 08/25/2015   K 4.1 08/25/2015   CL 113* 08/25/2015   CO2 24 08/25/2015   BUN 14 08/25/2015   CREATININE 0.60* 08/25/2015   PROT 7.6 08/24/2015   ALBUMIN 4.6 08/24/2015   BILITOT 0.9 08/24/2015   ALKPHOS 149* 08/24/2015   AST 27 08/24/2015   ALT 26 08/24/2015  .   Total Time in preparing paper work, data evaluation and todays exam - 35 minutes  Jovanie Verge M.D on 08/25/2015 at 3:08 PM    Note: This dictation was prepared with Dragon dictation along with smaller phrase technology. Any transcriptional errors that result from this process are unintentional.

## 2015-08-25 NOTE — Progress Notes (Signed)
Mr. Shawn Frazier is alert and oriented. No c/o pain. Mood pleasant and cooperative. Pt is NSR on monitor. Take medications whole. Pt ambulated using walker in the hallway without any difficulty. Room air. Lungs clear. VSS.

## 2015-08-25 NOTE — Discharge Instructions (Signed)
Atrial Fibrillation °Atrial fibrillation is a type of heartbeat that is irregular or fast (rapid). If you have this condition, your heart keeps quivering in a weird (chaotic) way. This condition can make it so your heart cannot pump blood normally. Having this condition gives a person more risk for stroke, heart failure, and other heart problems. There are different types of atrial fibrillation. Talk with your doctor to learn about the type that you have. °HOME CARE °· Take over-the-counter and prescription medicines only as told by your doctor. °· If your doctor prescribed a blood-thinning medicine, take it exactly as told. Taking too much of it can cause bleeding. If you do not take enough of it, you will not have the protection that you need against stroke and other problems. °· Do not use any tobacco products. These include cigarettes, chewing tobacco, and e-cigarettes. If you need help quitting, ask your doctor. °· If you have apnea (obstructive sleep apnea), manage it as told by your doctor. °· Do not drink alcohol. °· Do not drink beverages that have caffeine. These include coffee, soda, and tea. °· Maintain a healthy weight. Do not use diet pills unless your doctor says they are safe for you. Diet pills may make heart problems worse. °· Follow diet instructions as told by your doctor. °· Exercise regularly as told by your doctor. °· Keep all follow-up visits as told by your doctor. This is important. °GET HELP IF: °· You notice a change in the speed, rhythm, or strength of your heartbeat. °· You are taking a blood-thinning medicine and you notice more bruising. °· You get tired more easily when you move or exercise. °GET HELP RIGHT AWAY IF: °· You have pain in your chest or your belly (abdomen). °· You have sweating or weakness. °· You feel sick to your stomach (nauseous). °· You notice blood in your throw up (vomit), poop (stool), or pee (urine). °· You are short of breath. °· You suddenly have swollen feet  and ankles. °· You feel dizzy. °· Your suddenly get weak or numb in your face, arms, or legs, especially if it happens on one side of your body. °· You have trouble talking, trouble understanding, or both. °· Your face or your eyelid droops on one side. °These symptoms may be an emergency. Do not wait to see if the symptoms will go away. Get medical help right away. Call your local emergency services (911 in the [place] Do not drive yourself to the hospital. °  °This information is not intended to replace advice given to you by your health care provider. Make sure you discuss any questions you have with your health care provider. °  °Document Released: 05/10/2008 Document Revised: 04/22/2015 Document Reviewed: 11/26/2014 °Elsevier Interactive Patient Education ©2016 Else[employer] Inc. ° °

## 2015-08-25 NOTE — Progress Notes (Signed)
Patient converted from atrial fibrillation to normal sinus rhythm (current HR 65) at 0100.  Patient alert and oriented, states he is feeling "much better".  Will continue to monitor.

## 2015-08-25 NOTE — Consult Note (Signed)
Reason for Consult: paroxysmal atrial fibrillation wide complex tachycardia Referring Physician: Dr Hollice Gong [facility] Dr. De Burrs is an 65 y.o. male.  HPI:  History of known atrial fibrillation on Tikosyn and metoprolol has history of hypertension coronary disease presents with multiple episodes of tachycardia lasting only a few minutes but this last episode prior to admission lasted much longer so he came to emergency room for evaluation. Denies syncope or blackout spells had vague chest pressure and discomfort. In emergency room he had paroxysmal AFib with heart rates of 150 also episodes of wide complex tachycardia possibly thought to be ventricular tachycardia. Patient was treated in emergency room with metoprolol which seemed to improve his symptoms he was found have mild hypokalemia which was corrected. Patient has a history of coronary disease PCI and stent in the past last low-dose Nehemiah Massed last year.  Past Medical History  Diagnosis Date  . Hypertension   . Coronary artery disease   . Peripheral vascular disease ([place])   . Edema     Past Surgical History  Procedure Laterality Date  . Stent placement vascular ([facility] hx)    . Cholecystectomy    . Back surgery    . Spinal fusion      Family History  Problem Relation Age of Onset  . Heart disease Mother   . Heart disease Father     Social History:  reports that he has been smoking Cigarettes.  He has been smoking about 1.00 pack per day. He does not have any smokeless tobacco history on file. He reports that he does not drink alcohol or use illicit drugs.  Allergies:  Allergies  Allergen Reactions  . Shellfish Allergy Anaphylaxis  . Codeine Nausea And Vomiting  . Morphine And Related Nausea And Vomiting  . Motrin [Ibuprofen] Nausea And Vomiting  . Xarelto [Rivaroxaban] Other (See Comments)    Pt states that he urinated blood while on this medication.      Medications: I have reviewed the  patient's current medications.  Results for orders placed or performed during the hospital encounter of 08/24/15 (from the past 48 hour(s))  CBC     Status: Abnormal   Collection Time: 08/24/15  7:42 PM  Result Value Ref Range   WBC 5.9 3.8 - 10.6 K/uL   RBC 4.63 4.40 - 5.90 MIL/uL   Hemoglobin 14.5 13.0 - 18.0 g/dL   HCT 43.4 40.0 - 52.0 %   MCV 93.7 80.0 - 100.0 fL   MCH 31.4 26.0 - 34.0 pg   MCHC 33.5 32.0 - 36.0 g/dL   RDW 14.6 (H) 11.5 - 14.5 %   Platelets 177 150 - 440 K/uL  Comprehensive metabolic panel     Status: Abnormal   Collection Time: 08/24/15  7:42 PM  Result Value Ref Range   Sodium 141 135 - 145 mmol/L   Potassium 3.7 3.5 - 5.1 mmol/L    Comment: HEMOLYSIS AT THIS LEVEL MAY AFFECT RESULT   Chloride 106 101 - 111 mmol/L   CO2 26 22 - 32 mmol/L   Glucose, Bld 148 (H) 65 - 99 mg/dL   BUN 15 6 - 20 mg/dL   Creatinine, Ser 0.74 0.61 - 1.24 mg/dL   Calcium 9.4 8.9 - 10.3 mg/dL   Total Protein 7.6 6.5 - 8.1 g/dL   Albumin 4.6 3.5 - 5.0 g/dL   AST 27 15 - 41 U/L   ALT 26 17 - 63 U/L   Alkaline Phosphatase 149 (  H) 38 - 126 U/L   Total Bilirubin 0.9 0.3 - 1.2 mg/dL   GFR calc non Af Amer >60 >60 mL/min   GFR calc Af Amer >60 >60 mL/min    Comment: (NOTE) The eGFR has been calculated using the CKD EPI equation. This calculation has not been validated in all clinical situations. eGFR's persistently <60 mL/min signify possible Chronic Kidney Disease.    Anion gap 9 5 - 15  Lipase, blood     Status: None   Collection Time: 08/24/15  7:42 PM  Result Value Ref Range   Lipase 37 11 - 51 U/L  Magnesium     Status: None   Collection Time: 08/24/15  7:42 PM  Result Value Ref Range   Magnesium 2.0 1.7 - 2.4 mg/dL  Troponin I     Status: None   Collection Time: 08/24/15  7:42 PM  Result Value Ref Range   Troponin I <0.03 <0.031 ng/mL    Comment:        NO INDICATION OF MYOCARDIAL INJURY.   Protime-INR     Status: None   Collection Time: 08/24/15  7:42 PM   Result Value Ref Range   Prothrombin Time 12.6 11.4 - 15.0 seconds   INR 0.92   APTT     Status: None   Collection Time: 08/24/15  7:42 PM  Result Value Ref Range   aPTT 31 24 - 36 seconds  Glucose, capillary     Status: Abnormal   Collection Time: 08/24/15 11:06 PM  Result Value Ref Range   Glucose-Capillary 124 (H) 65 - 99 mg/dL  MRSA PCR Screening     Status: None   Collection Time: 08/24/15 11:15 PM  Result Value Ref Range   MRSA by PCR NEGATIVE NEGATIVE    Comment:        The GeneXpert MRSA Assay (FDA approved for NASAL specimens only), is one component of a comprehensive MRSA colonization surveillance program. It is not intended to diagnose MRSA infection nor to guide or monitor treatment for MRSA infections.   Troponin I     Status: None   Collection Time: 08/25/15 12:01 AM  Result Value Ref Range   Troponin I <0.03 <0.031 ng/mL    Comment:        NO INDICATION OF MYOCARDIAL INJURY.   TSH     Status: None   Collection Time: 08/25/15 12:01 AM  Result Value Ref Range   TSH 2.285 0.350 - 4.500 uIU/mL  Basic metabolic panel     Status: Abnormal   Collection Time: 08/25/15  6:52 AM  Result Value Ref Range   Sodium 140 135 - 145 mmol/L   Potassium 4.1 3.5 - 5.1 mmol/L   Chloride 113 (H) 101 - 111 mmol/L   CO2 24 22 - 32 mmol/L   Glucose, Bld 96 65 - 99 mg/dL   BUN 14 6 - 20 mg/dL   Creatinine, Ser 0.60 (L) 0.61 - 1.24 mg/dL   Calcium 8.3 (L) 8.9 - 10.3 mg/dL   GFR calc non Af Amer >60 >60 mL/min   GFR calc Af Amer >60 >60 mL/min    Comment: (NOTE) The eGFR has been calculated using the CKD EPI equation. This calculation has not been validated in all clinical situations. eGFR's persistently <60 mL/min signify possible Chronic Kidney Disease.    Anion gap 3 (L) 5 - 15  CBC     Status: Abnormal   Collection Time: 08/25/15  6:52 AM  Result  Value Ref Range   WBC 4.4 3.8 - 10.6 K/uL   RBC 4.01 (L) 4.40 - 5.90 MIL/uL   Hemoglobin 12.3 (L) 13.0 - 18.0 g/dL    HCT 37.0 (L) 40.0 - 52.0 %   MCV 92.2 80.0 - 100.0 fL   MCH 30.7 26.0 - 34.0 pg   MCHC 33.3 32.0 - 36.0 g/dL   RDW 14.1 11.5 - 14.5 %   Platelets 155 150 - 440 K/uL  Troponin I     Status: None   Collection Time: 08/25/15  6:52 AM  Result Value Ref Range   Troponin I <0.03 <0.031 ng/mL    Comment:        NO INDICATION OF MYOCARDIAL INJURY.   Lipid panel     Status: Abnormal   Collection Time: 08/25/15  6:52 AM  Result Value Ref Range   Cholesterol 97 0 - 200 mg/dL   Triglycerides 91 <150 mg/dL   HDL 30 (L) >40 mg/dL   Total CHOL/HDL Ratio 3.2 RATIO   VLDL 18 0 - 40 mg/dL   LDL Cholesterol 49 0 - 99 mg/dL    Comment:        Total Cholesterol/HDL:CHD Risk Coronary Heart Disease Risk Table                     Men   Women  1/2 Average Risk   3.4   3.3  Average Risk       5.0   4.4  2 X Average Risk   9.6   7.1  3 X Average Risk  23.4   11.0        Use the calculated Patient Ratio above and the CHD Risk Table to determine the patient's CHD Risk.        ATP III CLASSIFICATION (LDL):  <100     mg/dL   Optimal  100-129  mg/dL   Near or Above                    Optimal  130-159  mg/dL   Borderline  160-189  mg/dL   High  >190     mg/dL   Very High     Dg Chest Port 1 View  08/24/2015  CLINICAL DATA:  Acute chest pain. EXAM: PORTABLE CHEST 1 VIEW COMPARISON:  April 19, 2006. FINDINGS: The heart size and mediastinal contours are within normal limits. Both lungs are clear. No pneumothorax or pleural effusion is noted. The visualized skeletal structures are unremarkable. IMPRESSION: No acute cardiopulmonary abnormality seen. Electronically Signed   By: Marijo Conception, M.D.   On: 08/24/2015 20:31    Review of Systems  Constitutional: Positive for malaise/fatigue.  HENT: Negative.   Respiratory: Positive for shortness of breath.   Cardiovascular: Positive for chest pain, palpitations and leg swelling.  Gastrointestinal: Negative.   Genitourinary: Negative.    Musculoskeletal: Negative.   Neurological: Positive for weakness.  Endo/Heme/Allergies: Negative.   Psychiatric/Behavioral: Negative.    Blood pressure 124/70, pulse 71, temperature 98.5 F (36.9 C), temperature source Oral, resp. rate 18, height 6' 2"  (1.88 m), weight 94.62 kg (208 lb 9.6 oz), SpO2 93 %. Physical Exam  Nursing note and vitals reviewed. Constitutional: He is oriented to person, place, and time. He appears well-developed and well-nourished.  HENT:  Head: Normocephalic and atraumatic.  Eyes: Conjunctivae and EOM are normal. Pupils are equal, round, and reactive to light.  Neck: Normal range of motion. Neck supple.  Cardiovascular: Normal rate, regular rhythm and normal heart sounds.   Respiratory: Effort normal and breath sounds normal.  GI: Soft. Bowel sounds are normal.  Musculoskeletal: Normal range of motion.  Neurological: He is alert and oriented to person, place, and time. He has normal reflexes.  Skin: Skin is warm and dry.  Psychiatric: He has a normal mood and affect.    Assessment/Plan: Atrial fibrillation paroxysmal  palpitations  tachycardia  hypertension  coronary disease  peripheral vascular disease  edema  hyperlipidemia  hypokalemia GERD  anxiety . PLAN  agree with ICU admission  continue Tikosyn twice a day  increase metoprolol to 75 mg twice a day  continue aspirin for anticoagulation consider long-term anticoagulation because of significant [place] score of 2  continue diuresis therapy for edema  treatment for Xanax to be maintain  increased activity well while on increased dose of metoprolol  correct electrolytes including hypokalemia the potassium greater than 4  outpatient follow-up with EP as an outpatient  the could be hypertension control with metoprolol and losartan  lipid management with Lipitor  reflux therapy continue omeprazole     CALLWOOD,DWAYNE D. 08/25/2015, 10:45 AM

## 2015-08-25 NOTE — Progress Notes (Signed)
*  PRELIMINARY RESULTS* Echocardiogram 2D Echocardiogram has been performed.  Georgann HousekeeperJerry R Hege 08/25/2015, 10:42 AM

## 2015-10-15 ENCOUNTER — Inpatient Hospital Stay
Admission: EM | Admit: 2015-10-15 | Discharge: 2015-10-16 | DRG: 310 | Disposition: A | Discharge status: Other Acute Inpatient Hospital | Payer: Medicare Other | Attending: Specialist | Admitting: Specialist

## 2015-10-15 ENCOUNTER — Encounter: Payer: Self-pay | Admitting: Emergency Medicine

## 2015-10-15 DIAGNOSIS — Z7982 Long term (current) use of aspirin: Secondary | ICD-10-CM

## 2015-10-15 DIAGNOSIS — I482 Chronic atrial fibrillation: Secondary | ICD-10-CM | POA: Diagnosis present

## 2015-10-15 DIAGNOSIS — R001 Bradycardia, unspecified: Secondary | ICD-10-CM | POA: Diagnosis present

## 2015-10-15 DIAGNOSIS — J449 Chronic obstructive pulmonary disease, unspecified: Secondary | ICD-10-CM | POA: Diagnosis present

## 2015-10-15 DIAGNOSIS — I739 Peripheral vascular disease, unspecified: Secondary | ICD-10-CM | POA: Diagnosis present

## 2015-10-15 DIAGNOSIS — Z888 Allergy status to other drugs, medicaments and biological substances status: Secondary | ICD-10-CM | POA: Diagnosis not present

## 2015-10-15 DIAGNOSIS — Z885 Allergy status to narcotic agent status: Secondary | ICD-10-CM | POA: Diagnosis not present

## 2015-10-15 DIAGNOSIS — Z955 Presence of coronary angioplasty implant and graft: Secondary | ICD-10-CM | POA: Diagnosis not present

## 2015-10-15 DIAGNOSIS — E785 Hyperlipidemia, unspecified: Secondary | ICD-10-CM | POA: Diagnosis present

## 2015-10-15 DIAGNOSIS — I4891 Unspecified atrial fibrillation: Secondary | ICD-10-CM | POA: Diagnosis present

## 2015-10-15 DIAGNOSIS — I10 Essential (primary) hypertension: Secondary | ICD-10-CM | POA: Diagnosis present

## 2015-10-15 DIAGNOSIS — R000 Tachycardia, unspecified: Secondary | ICD-10-CM

## 2015-10-15 DIAGNOSIS — Z91013 Allergy to seafood: Secondary | ICD-10-CM

## 2015-10-15 DIAGNOSIS — I2510 Atherosclerotic heart disease of native coronary artery without angina pectoris: Secondary | ICD-10-CM | POA: Diagnosis present

## 2015-10-15 DIAGNOSIS — I495 Sick sinus syndrome: Secondary | ICD-10-CM | POA: Diagnosis present

## 2015-10-15 DIAGNOSIS — F17210 Nicotine dependence, cigarettes, uncomplicated: Secondary | ICD-10-CM | POA: Diagnosis present

## 2015-10-15 DIAGNOSIS — Z981 Arthrodesis status: Secondary | ICD-10-CM

## 2015-10-15 DIAGNOSIS — I480 Paroxysmal atrial fibrillation: Secondary | ICD-10-CM | POA: Diagnosis present

## 2015-10-15 DIAGNOSIS — Z79899 Other long term (current) drug therapy: Secondary | ICD-10-CM | POA: Diagnosis not present

## 2015-10-15 DIAGNOSIS — Z886 Allergy status to analgesic agent status: Secondary | ICD-10-CM

## 2015-10-15 DIAGNOSIS — K219 Gastro-esophageal reflux disease without esophagitis: Secondary | ICD-10-CM | POA: Diagnosis present

## 2015-10-15 LAB — CBC
HCT: 42 % (ref 40.0–52.0)
HEMOGLOBIN: 14.1 g/dL (ref 13.0–18.0)
MCH: 30.9 pg (ref 26.0–34.0)
MCHC: 33.6 g/dL (ref 32.0–36.0)
MCV: 92.2 fL (ref 80.0–100.0)
PLATELETS: 197 10*3/uL (ref 150–440)
RBC: 4.55 MIL/uL (ref 4.40–5.90)
RDW: 14.3 % (ref 11.5–14.5)
WBC: 6.7 10*3/uL (ref 3.8–10.6)

## 2015-10-15 LAB — BASIC METABOLIC PANEL
ANION GAP: 6 (ref 5–15)
BUN: 12 mg/dL (ref 6–20)
CHLORIDE: 108 mmol/L (ref 101–111)
CO2: 26 mmol/L (ref 22–32)
CREATININE: 0.67 mg/dL (ref 0.61–1.24)
Calcium: 9.1 mg/dL (ref 8.9–10.3)
GFR calc non Af Amer: >60 mL/min (ref >60)
Glucose, Bld: 103 mg/dL — ABNORMAL HIGH (ref 65–99)
Potassium: 4.2 mmol/L (ref 3.5–5.1)
SODIUM: 140 mmol/L (ref 135–145)

## 2015-10-15 LAB — TROPONIN I

## 2015-10-15 MED ORDER — ONDANSETRON HCL 4 MG/2ML IJ SOLN
4.0000 mg | Freq: Once | INTRAMUSCULAR | Status: AC
  Administered 2015-10-15: 4 mg via INTRAVENOUS

## 2015-10-15 MED ORDER — HYDROMORPHONE HCL 1 MG/ML IJ SOLN
INTRAMUSCULAR | Status: AC
  Administered 2015-10-15: 0.5 mg via INTRAVENOUS
  Filled 2015-10-15: qty 1

## 2015-10-15 MED ORDER — ASPIRIN 81 MG PO CHEW
324.0000 mg | CHEWABLE_TABLET | Freq: Once | ORAL | Status: AC
  Administered 2015-10-15: 324 mg via ORAL

## 2015-10-15 MED ORDER — ONDANSETRON HCL 4 MG/2ML IJ SOLN
INTRAMUSCULAR | Status: AC
  Administered 2015-10-15: 4 mg via INTRAVENOUS
  Filled 2015-10-15: qty 2

## 2015-10-15 MED ORDER — HYDROMORPHONE HCL 1 MG/ML IJ SOLN
1.0000 mg | Freq: Once | INTRAMUSCULAR | Status: AC
  Administered 2015-10-15: 1 mg via INTRAVENOUS
  Filled 2015-10-15: qty 1

## 2015-10-15 MED ORDER — HYDROMORPHONE HCL 1 MG/ML IJ SOLN
0.5000 mg | Freq: Once | INTRAMUSCULAR | Status: AC
  Administered 2015-10-15: 0.5 mg via INTRAVENOUS

## 2015-10-15 MED ORDER — ASPIRIN 81 MG PO CHEW
CHEWABLE_TABLET | ORAL | Status: AC
  Administered 2015-10-15: 324 mg via ORAL
  Filled 2015-10-15: qty 4

## 2015-10-15 NOTE — H&P (Signed)
[facility] - [place] at [facility]   PATIENT NAME: Shawn Frazier    MR#:  [id number]  DATE OF BIRTH:  Apr 03, 1951  DATE OF ADMISSION:  10/15/2015  PRIMARY CARE PHYSICIAN: [facility]  Primary cardiologist Dr. Gwen Pounds  REQUESTING/REFERRING PHYSICIAN: Dr. Darnelle Catalan  CHIEF COMPLAINT:   Rapid heart rate, generalized weakness and feeling drained out HISTORY OF PRESENT ILLNESS:  Shawn Frazier  is a 65 y.o. male with a known history of atrial fibrillation not on any anticoagulation due to bleeding from oral anticoagulation, hypertension, coronary artery disease comes to the emergency room with complaints of palpitation weakness and feeling worn out. Patient was found to be in A. fib with tachycardia and was noted to bradycardia down into the  40s intermittently in the emergency room. His blood pressure remained stable in the 110s to 1:15 systolic in the emergency room. External pacemaker was placed and the patient continued to remain tachybradycardia with occasional captured beat. Emergency room physician spoke with cardiology Dr. Evette Georges recommended admission and further evaluation in morning.   PAST MEDICAL HISTORY:   Past Medical History  Diagnosis Date  . Hypertension   . Coronary artery disease   . Peripheral vascular disease (HCC)   . Edema     PAST SURGICAL HISTOIRY:   Past Surgical History  Procedure Laterality Date  . Stent placement vascular ([facility] hx)    . Cholecystectomy    . Back surgery    . Spinal fusion      SOCIAL HISTORY:   Social History  Substance Use Topics  . Smoking status: Current Every Day Smoker -- 1.00 packs/day    Types: Cigarettes  . Smokeless tobacco: Not on file  . Alcohol Use: No    FAMILY HISTORY:   Family History  Problem Relation Age of Onset  . Heart disease Mother   . Heart disease Father     DRUG ALLERGIES:   Allergies  Allergen Reactions  . Shellfish Allergy Anaphylaxis  . Codeine Nausea And  Vomiting  . Morphine And Related Nausea And Vomiting  . Motrin [Ibuprofen] Nausea And Vomiting  . Xarelto [Rivaroxaban] Other (See Comments)    Pt states that he urinated blood while on this medication.      REVIEW OF SYSTEMS:  Review of Systems  Constitutional: Negative for fever, chills and weight loss.  HENT: Negative for ear discharge, ear pain and nosebleeds.   Eyes: Negative for blurred vision, pain and discharge.  Respiratory: Negative for sputum production, shortness of breath, wheezing and stridor.   Cardiovascular: Positive for chest pain and palpitations. Negative for orthopnea and PND.       Palpitations  Gastrointestinal: Negative for nausea, vomiting, abdominal pain and diarrhea.  Genitourinary: Negative for urgency and frequency.  Musculoskeletal: Negative for back pain and joint pain.  Neurological: Positive for weakness. Negative for sensory change, speech change and focal weakness.  Psychiatric/Behavioral: Negative for depression and hallucinations. The patient is not nervous/anxious.   All other systems reviewed and are negative.    MEDICATIONS AT HOME:   Prior to Admission medications   Medication Sig Start Date End Date Taking? Authorizing Provider  aspirin EC 81 MG tablet Take 81 mg by mouth daily.   Yes Historical Provider, MD  atorvastatin (LIPITOR) 80 MG tablet Take 80 mg by mouth every evening.   Yes Historical Provider, MD  budesonide-formoterol (SYMBICORT) 160-4.5 MCG/ACT inhaler Inhale 2 puffs into the lungs 2 (two) times daily as needed (for shortness of breath).  Yes Historical Provider, MD  dofetilide (TIKOSYN) 500 MCG capsule Take 500 mcg by mouth 2 (two) times daily.   Yes Historical Provider, MD  losartan (COZAAR) 100 MG tablet Take 100 mg by mouth daily.   Yes Historical Provider, MD  Magnesium 250 MG TABS Take 250 mg by mouth daily.   Yes Historical Provider, MD  magnesium oxide (MAG-OX) 400 MG tablet Take 400 mg by mouth every evening.   Yes  Historical Provider, MD  meclizine (ANTIVERT) 25 MG tablet Take 25 mg by mouth 3 (three) times daily as needed for dizziness.   Yes Historical Provider, MD  metoCLOPramide (REGLAN) 10 MG tablet Take 10 mg by mouth 4 (four) times daily as needed for nausea.   Yes Historical Provider, MD  Multiple Vitamin (MULTIVITAMIN WITH MINERALS) TABS tablet Take 1 tablet by mouth daily.   Yes Historical Provider, MD  Omega-3 Fatty Acids (FISH OIL) 500 MG CAPS Take 500 mg by mouth daily.   Yes Historical Provider, MD  omeprazole (PRILOSEC) 20 MG capsule Take 20 mg by mouth daily.   Yes Historical Provider, MD      VITAL SIGNS:  Blood pressure 115/83, pulse 121, temperature 98.4 F (36.9 C), temperature source Oral, resp. rate 15, height 6\' 2"  (1.88 m), weight 96.163 kg (212 lb), SpO2 96 %.  PHYSICAL EXAMINATION:  GENERAL:  65 y.o.-year-old patient lying in the bed with no acute distress.  EYES: Pupils equal, round, reactive to light and accommodation. No scleral icterus. Extraocular muscles intact.  HEENT: Head atraumatic, normocephalic. Oropharynx and nasopharynx clear.  NECK:  Supple, no jugular venous distention. No thyroid enlargement, no tenderness.  LUNGS: Normal breath sounds bilaterally, no wheezing, rales,rhonchi or crepitation. No use of accessory muscles of respiration.  CARDIOVASCULAR: S1, S2 normal. No murmurs, rubs, or gallops. External pacer present. Irregularly irregular heart rhythm ABDOMEN: Soft, nontender, nondistended. Bowel sounds present. No organomegaly or mass.  EXTREMITIES: No pedal edema, cyanosis, or clubbing.  NEUROLOGIC: Cranial nerves II through XII are intact. Muscle strength 5/5 in all extremities. Sensation intact. Gait not checked.  PSYCHIATRIC: The patient is alert and oriented x 3.  SKIN: No obvious rash, lesion, or ulcer.   LABORATORY PANEL:   CBC  Recent Labs Lab 10/15/15 2054  WBC 6.7  HGB 14.1  HCT 42.0  PLT 197    ------------------------------------------------------------------------------------------------------------------  Chemistries   Recent Labs Lab 10/15/15 2054  NA 140  K 4.2  CL 108  CO2 26  GLUCOSE 103*  BUN 12  CREATININE 0.67  CALCIUM 9.1   ------------------------------------------------------------------------------------------------------------------  Cardiac Enzymes  Recent Labs Lab 10/15/15 2054  TROPONINI <0.03   ------------------------------------------------------------------------------------------------------------------  RADIOLOGY:  No results found.  EKG:  Tachybradycardia syndrome  IMPRESSION AND PLAN:  Quandre Polinski  is a 65 y.o. male with a known history of atrial fibrillation not on any anticoagulation due to bleeding from oral anticoagulation, hypertension, coronary artery disease comes to the emergency room with complaints of palpitation weakness and feeling worn out. Patient was found to be in A. fib with tachycardia and was noted to bradycardia down into the  40s intermittently in the emergency room.  1. Tachybradycardia syndrome Patient has history of chronic A. fib on metoprolol and tikosyn. He was recently admitted in January with atrial fibrillation with RVR and his metoprolol dose was increased to 75 twice a day. Family states patient goes into rapid A. fib once a week since discharge. -Admit to ICU -Cardiology consultation -Patient is hemodynamically stable with relatively stable  blood pressure meds will give when necessary IV Cardizem if needed for persistent A. Fib -Patient may need a pacemaker. We'll keep him nothing by mouth after midnight  2. History of chronic rapid A. Fib -Patient took his metoprolol and TIkosyn tonight. -Not on any oral anticoagulation due to bleeding in the past. Takes aspirin daily.  3. Coronary artery disease continue cardiac meds  4. Hypertension we'll continue home meds  5. DVT prophylaxis  subcutaneous Lovenox   All the records are reviewed and case discussed with ED provider. Management plans discussed with the patient, family and they are in agreement.  CODE STATUS: full  TOTAL CRITICAL TIME TAKING CARE OF THIS PATIENT 50 minutes.    Braxtin Bamba M.D on 10/15/2015 at 11:19 PM  Between 7am to 6pm - Pager - [phone]  After 6pm go to [email or web address] - password [email or web address] [facility]  [facility]  Office  [phone]  CC: Primary care physician; No primary care provider on file.

## 2015-10-15 NOTE — ED Notes (Signed)
Pt arrived by EMS, 97%RA, from home with c/o Afib. EMS reports pt ranging from 70-130bpm with pt stating "Every time I have an episode, I feel like im in an elevator, that dropping feeling." Pt states he has had the episodes since noon today along with Nausea, no vomiting, SOB, frequent urination and has been unable to have a BM since the AM. Pt also reports having BP going up and down since episodes started. Pt on assessment is having Fib flutter with frequent pauses (3-6 seconds long). HR drops to 30 that returns to 130 bpm. Dr. Darnelle Catalan at bedside.

## 2015-10-15 NOTE — ED Notes (Signed)
Pt placed on 2L O2 for comfort during pacer activity.

## 2015-10-15 NOTE — ED Notes (Addendum)
Pacer dialed up to 50ma at a rate of 70ppm. Captured with 1 sinus note during pause with return Afib rate of 123.

## 2015-10-15 NOTE — ED Notes (Signed)
Pt being paced by Dr. Darnelle Catalan at bedside

## 2015-10-15 NOTE — ED Provider Notes (Signed)
[facility] Emergency Department Provider Note  ____________________________________________  Time seen: Approximately 9:11 PM  I have reviewed the triage vital signs and the nursing notes.   HISTORY  Chief Complaint Chest Pain    HPI Shawn Frazier is a 65 y.o. male who reports episodes of rapid and then slow heart rate. When the slow heart rate And patient feel patient reports he feels very weak and ill like if the "bottom dropped out" patient reports she's had this intermittently since 2006 or 9 when he had a stent placed. A bad today he's had repeated episodes today his chest does not feel good when the bradycardia cardiac episodes happen he cannot really say that is tight forming he's had multiple episodes in the ER so far. I discussed this with Dr. Ann Maki shows who devises to admit him and go from there.  Past Medical History  Diagnosis Date  . Hypertension   . Coronary artery disease   . Peripheral vascular disease (HCC)   . Edema     Patient Active Problem List   Diagnosis Date Noted  . Rapid atrial fibrillation (HCC) 08/24/2015    Past Surgical History  Procedure Laterality Date  . Stent placement vascular ([facility] hx)    . Cholecystectomy    . Back surgery    . Spinal fusion      Current Outpatient Rx  Name  Route  Sig  Dispense  Refill  . amoxicillin-clavulanate (AUGMENTIN) 875-125 MG tablet   Oral   Take 1 tablet by mouth every 12 (twelve) hours.   20 tablet   0   . aspirin EC 81 MG tablet   Oral   Take 81 mg by mouth daily.         Marland Kitchen atorvastatin (LIPITOR) 80 MG tablet   Oral   Take 80 mg by mouth every evening.         . budesonide-formoterol (SYMBICORT) 160-4.5 MCG/ACT inhaler   Inhalation   Inhale 2 puffs into the lungs 2 (two) times daily as needed (for shortness of breath).         . dofetilide (TIKOSYN) 500 MCG capsule   Oral   Take 500 mcg by mouth 2 (two) times daily.         Marland Kitchen losartan (COZAAR) 100 MG  tablet   Oral   Take 100 mg by mouth daily.         . Magnesium 250 MG TABS   Oral   Take 250 mg by mouth daily.         . magnesium oxide (MAG-OX) 400 MG tablet   Oral   Take 400 mg by mouth every evening.         . meclizine (ANTIVERT) 25 MG tablet   Oral   Take 25 mg by mouth 3 (three) times daily as needed for dizziness.         . metoCLOPramide (REGLAN) 10 MG tablet   Oral   Take 10 mg by mouth 4 (four) times daily as needed for nausea.         . Metoprolol Tartrate 75 MG TABS   Oral   Take 75 mg by mouth 2 (two) times daily.   30 tablet   0   . Multiple Vitamin (MULTIVITAMIN WITH MINERALS) TABS tablet   Oral   Take 1 tablet by mouth daily.         . Omega-3 Fatty Acids (FISH OIL) 500 MG CAPS  Oral   Take 500 mg by mouth daily.         Marland Kitchen omeprazole (PRILOSEC) 20 MG capsule   Oral   Take 20 mg by mouth daily.           Allergies Shellfish allergy; Codeine; Morphine and related; Motrin; and Xarelto  Family History  Problem Relation Age of Onset  . Heart disease Mother   . Heart disease Father     Social History Social History  Substance Use Topics  . Smoking status: Current Every Day Smoker -- 1.00 packs/day    Types: Cigarettes  . Smokeless tobacco: None  . Alcohol Use: No    Review of Systems Constitutional: No fever/chills Eyes: No visual changes. ENT: No sore throat. Cardiovascular: See history of present illness Respiratory: Denies shortness of breath. Gastrointestinal: No abdominal pain.  No nausea, no vomiting.  No diarrhea.  No constipation. Genitourinary: Negative for dysuria. Musculoskeletal: Negative for back pain. Skin: Negative for rash. Neurological: Negative for headaches, focal weakness or numbness.  10-point ROS otherwise negative.  ____________________________________________   PHYSICAL EXAM:  VITAL SIGNS: ED Triage Vitals  Enc Vitals Group     BP 10/15/15 2106 140/113 mmHg     Pulse Rate 10/15/15  2106 131     Resp 10/15/15 2106 20     Temp 10/15/15 2106 98.4 F (36.9 C)     Temp Source 10/15/15 2106 Oral     SpO2 10/15/15 2055 97 %     Weight 10/15/15 2106 212 lb (96.163 kg)     Height 10/15/15 2106  (1.88 m)     Head Cir --      Peak Flow --      Pain Score --      Pain Loc --      Pain Edu? --      Excl. in GC? --   Constitutional: Alert and oriented. Well appearing and in no acute distress. Eyes: Conjunctivae are normal. PERRL. EOMI. Head: Atraumatic. Nose: No congestion/rhinnorhea. Mouth/Throat: Mucous membranes are moist.  Oropharynx non-erythematous. Neck: No stridor. Cardiovascular: Normal rate, regular rhythm. Grossly normal heart sounds.  Good peripheral circulation. Respiratory: Normal respiratory effort.  No retractions. Lungs CTAB. Gastrointestinal: Soft and nontender. No distention. No abdominal bruits. No CVA tenderness. Musculoskeletal: No lower extremity tenderness nor edema.  No joint effusions. Neurologic:  Normal speech and language. No gross focal neurologic deficits are appreciated. No gait instability. Skin:  Skin is warm, dry and intact. No rash noted. Psychiatric: Mood and affect are normal. Speech and behavior are normal.  ____________________________________________   LABS (all labs ordered are listed, but only abnormal results are displayed)  Labs Reviewed  CBC  BASIC METABOLIC PANEL  TROPONIN I   ____________________________________________  EKG  EKG shows what appears to be a flutter with long pauses. ____________________________________________  RADIOLOGY  Chest x-ray has not been done at this point ____________________________________________   PROCEDURES  Patient has progressed to the point of almost passing out during his long pauses. Pacemaker transcutaneous was placed and after multiple adjustments we were able to achieve capture during his positives  ____________________________________________   INITIAL  IMPRESSION / ASSESSMENT AND PLAN / ED COURSE  Pertinent labs & imaging results that were available during my care of the patient were reviewed by me and considered in my medical decision making (see chart for details).   ____________________________________________   FINAL CLINICAL IMPRESSION(S) / ED DIAGNOSES  Final diagnoses:  Tachycardia  Bradycardia   Real  diagnosis as tachybradycardia syndrome   Arnaldo Natal, MD 10/15/15 2150

## 2015-10-15 NOTE — ED Notes (Signed)
Pace unable to capture, pads changed, zoll changed.

## 2015-10-15 NOTE — ED Notes (Signed)
Pt in Fib Flutter with 3-6 second pauses. Pt placed on pacer at bedside.

## 2015-10-16 ENCOUNTER — Ambulatory Visit (HOSPITAL_COMMUNITY)
Admission: AD | Admit: 2015-10-16 | Discharge: 2015-10-16 | Disposition: A | Discharge status: Home / Self Care | Payer: Medicare Other | Source: Other Acute Inpatient Hospital | Attending: Cardiology | Admitting: Cardiology

## 2015-10-16 DIAGNOSIS — I4891 Unspecified atrial fibrillation: Secondary | ICD-10-CM | POA: Insufficient documentation

## 2015-10-16 LAB — CBC
HCT: 40.9 % (ref 40.0–52.0)
Hemoglobin: 13.8 g/dL (ref 13.0–18.0)
MCH: 31.2 pg (ref 26.0–34.0)
MCHC: 33.7 g/dL (ref 32.0–36.0)
MCV: 92.8 fL (ref 80.0–100.0)
PLATELETS: 185 10*3/uL (ref 150–440)
RBC: 4.41 MIL/uL (ref 4.40–5.90)
RDW: 14.3 % (ref 11.5–14.5)
WBC: 6.5 10*3/uL (ref 3.8–10.6)

## 2015-10-16 LAB — CREATININE, SERUM: CREATININE: 0.64 mg/dL (ref 0.61–1.24)

## 2015-10-16 LAB — TROPONIN I
Troponin I: <0.03 ng/mL (ref <0.031)
Troponin I: <0.03 ng/mL (ref <0.031)

## 2015-10-16 LAB — MRSA PCR SCREENING: MRSA BY PCR: NEGATIVE

## 2015-10-16 LAB — TSH: TSH: 3.158 u[IU]/mL (ref 0.350–4.500)

## 2015-10-16 LAB — MAGNESIUM: Magnesium: 1.8 mg/dL (ref 1.7–2.4)

## 2015-10-16 LAB — PHOSPHORUS: PHOSPHORUS: 3.2 mg/dL (ref 2.5–4.6)

## 2015-10-16 MED ORDER — ONDANSETRON HCL 4 MG PO TABS: 4.0000 mg | ORAL_TABLET | Freq: Four times a day (QID) | ORAL | Status: DC | PRN

## 2015-10-16 MED ORDER — ONDANSETRON HCL 4 MG/2ML IJ SOLN
4.0000 mg | Freq: Four times a day (QID) | INTRAMUSCULAR | Status: DC | PRN
  Administered 2015-10-16: 4 mg via INTRAVENOUS
  Filled 2015-10-16: qty 2

## 2015-10-16 MED ORDER — ATORVASTATIN CALCIUM 20 MG PO TABS: 80.0000 mg | ORAL_TABLET | Freq: Every evening | ORAL | Status: DC

## 2015-10-16 MED ORDER — PANTOPRAZOLE SODIUM 40 MG PO TBEC
40.0000 mg | DELAYED_RELEASE_TABLET | Freq: Every day | ORAL | Status: DC
  Administered 2015-10-16: 40 mg via ORAL
  Filled 2015-10-16: qty 1

## 2015-10-16 MED ORDER — OMEGA-3-ACID ETHYL ESTERS 1 G PO CAPS
1.0000 g | ORAL_CAPSULE | Freq: Two times a day (BID) | ORAL | Status: DC
  Administered 2015-10-16: 1 g via ORAL
  Filled 2015-10-16: qty 1

## 2015-10-16 MED ORDER — METOCLOPRAMIDE HCL 10 MG PO TABS: 10.0000 mg | ORAL_TABLET | Freq: Four times a day (QID) | ORAL | Status: DC | PRN

## 2015-10-16 MED ORDER — MAGNESIUM OXIDE 400 (241.3 MG) MG PO TABS
400.0000 mg | ORAL_TABLET | Freq: Every day | ORAL | Status: DC
  Administered 2015-10-16: 400 mg via ORAL
  Filled 2015-10-16: qty 1

## 2015-10-16 MED ORDER — MAGNESIUM OXIDE 400 MG PO TABS: 400.0000 mg | ORAL_TABLET | Freq: Every evening | ORAL | Status: DC

## 2015-10-16 MED ORDER — ACETAMINOPHEN 650 MG RE SUPP: 650.0000 mg | Freq: Four times a day (QID) | RECTAL | Status: DC | PRN

## 2015-10-16 MED ORDER — ASPIRIN EC 81 MG PO TBEC
81.0000 mg | DELAYED_RELEASE_TABLET | Freq: Every day | ORAL | Status: DC
  Administered 2015-10-16: 81 mg via ORAL
  Filled 2015-10-16: qty 1

## 2015-10-16 MED ORDER — ACETAMINOPHEN 325 MG PO TABS: 650.0000 mg | ORAL_TABLET | Freq: Four times a day (QID) | ORAL | Status: DC | PRN

## 2015-10-16 MED ORDER — MOMETASONE FURO-FORMOTEROL FUM 200-5 MCG/ACT IN AERO
2.0000 | INHALATION_SPRAY | Freq: Two times a day (BID) | RESPIRATORY_TRACT | Status: DC
  Administered 2015-10-16 (×2): 2 via RESPIRATORY_TRACT
  Filled 2015-10-16: qty 8.8

## 2015-10-16 MED ORDER — MECLIZINE HCL 25 MG PO TABS: 25.0000 mg | ORAL_TABLET | Freq: Three times a day (TID) | ORAL | Status: DC | PRN

## 2015-10-16 MED ORDER — ADULT MULTIVITAMIN W/MINERALS CH: 1.0000 | ORAL_TABLET | Freq: Every day | ORAL | Status: DC

## 2015-10-16 MED ORDER — CETYLPYRIDINIUM CHLORIDE 0.05 % MT LIQD
7.0000 mL | Freq: Two times a day (BID) | OROMUCOSAL | Status: DC
  Administered 2015-10-16 (×2): 7 mL via OROMUCOSAL

## 2015-10-16 MED ORDER — LOSARTAN POTASSIUM 50 MG PO TABS
100.0000 mg | ORAL_TABLET | Freq: Every day | ORAL | Status: DC
  Administered 2015-10-16: 100 mg via ORAL
  Filled 2015-10-16: qty 2

## 2015-10-16 MED ORDER — ENOXAPARIN SODIUM 40 MG/0.4ML ~~LOC~~ SOLN
40.0000 mg | Freq: Every day | SUBCUTANEOUS | Status: DC
  Administered 2015-10-16: 40 mg via SUBCUTANEOUS
  Filled 2015-10-16: qty 0.4

## 2015-10-16 MED ORDER — SODIUM CHLORIDE 0.9 % IV SOLN
INTRAVENOUS | Status: DC
  Administered 2015-10-16: 01:00:00 via INTRAVENOUS

## 2015-10-16 MED ORDER — FENTANYL CITRATE (PF) 100 MCG/2ML IJ SOLN
25.0000 ug | INTRAMUSCULAR | Status: DC | PRN
  Administered 2015-10-16: 25 ug via INTRAVENOUS
  Administered 2015-10-16: 75 ug via INTRAVENOUS
  Administered 2015-10-16 (×2): 50 ug via INTRAVENOUS
  Filled 2015-10-16 (×4): qty 2

## 2015-10-16 NOTE — Progress Notes (Signed)
Spoke with Dr. Darrold JunkerParaschos on the phone, [facility]. Alerted medical MD, Dr. Cherlynn KaiserSainani, about patient's pending status. Informed patient about pending transfer. Spoke with [facility] transfer center, no accepting physician yet.

## 2015-10-16 NOTE — Progress Notes (Signed)
Spoke with Cardiologist, Dr. Darrold JunkerParaschos, about patient's situation with bradycardia moments and patient being on external pacemaker through zoll. MD had ordered RN to lower pacing threshold to 40 (rather than 50) and RN did so at 08:35. Patient then received three incidents of external pacing this morning (three pacing shocks each, at 08:55, 09:00, 09:07). Patient then converted to normal sinus rhythm/sinus bradycardia. RN updated cardiologist and cardiologist ordered zoll to be placed into monitoring mode unless patient begins to be symptomatic.

## 2015-10-16 NOTE — Progress Notes (Signed)
Patient is alert and oriented, pacer pads are in place and attached to the zoll (50ppm and 50 milliamps).  Patient remains in afib (HR 90's-120's), blood pressure and O2 sat remain stable.  Fentanyl given q2 hours for pain.  Will continue to monitor.

## 2015-10-16 NOTE — Progress Notes (Signed)
eLink Physician-Brief Progress Note Patient Name: Shawn RevelsDonald R Frazier DOB: 1950/11/15 MRN: [id number]   Date of Service  10/16/2015  HPI/Events of Note  [age] M with h/o of AF/RVR with recent increase in BB dose and recent hospitalization for AF/RVR.  On no anticoagulation due to h/o of GIB.  Patient presents with weakness and found to have tachybradycardia with HR as low as 40 in ED.   Admitted to ICU for continued monitoring.  Cardiology has been consulted.  Currently on camera check patient is alert, oriented and HD stable.  HR is AF with rates in the 120s.    eICU Interventions  Plan of care per primary admitting team. [facility]     Intervention Category Evaluation Type: New Patient Evaluation  DETERDING,ELIZABETH 10/16/2015, 12:31 AM

## 2015-10-16 NOTE — Progress Notes (Signed)
Spoke with Dr. Darrold JunkerParaschos about patient's Shawn Frazier not being ordered (patient currently sinus bradycardia 57). MD confirmed that he did not want patient to receive this medication at this time. Updated MDs (Dr. Darrold JunkerParaschos and Dr. Cherlynn KaiserSainani) [facility]. Updated patient's family on pending transfer [facility]. Per [facility] transfer center will call report and arrange transport after paperwork done.

## 2015-10-16 NOTE — Progress Notes (Signed)
Report called to Marchelle FolksAmanda, Charity fundraiserN at [facility] Patient to be moved to room 7106. Patient and family at bedside aware of transfer. Awaiting [facility] for transport. Patient alert and oriented, in normal sinus rhythm/sinus bradycardia. No complaints of pain. On 2L nasal cannula and O2s sats in upper 90s to 100s. Eating well and voiding into urinal.

## 2015-10-16 NOTE — Consult Note (Signed)
[facility]  CARDIOLOGY CONSULT NOTE  Patient ID: Shawn RevelsDonald R Frazier MRN: [id number] DOB/AGE: August 18, 1950 65 y.o.  Admit date: 10/15/2015 Referring Physician Cherlynn KaiserSainani Primary Physician [facility] Primary Cardiologist Gwen PoundsKowalski Reason for Consultation atrial fibrillation  HPI: 65 year old gentleman referred for evaluation of paroxysmal atrial fibrillation and bradycardia. The patient has known coronary artery disease, status post coronary stent 2 years ago. Patient has known paroxysmal atrial fibrillation, currently on Tikosyn for rhythm control. Patient was previously anticoagulated was a relatively low which was discontinued due to hematuria. The patient reports a recent history of recurrent episodes of tachycardia presumed to be atrial fibrillation. He [facility] room where his noted to be an atrial fibrillation with a rapid ventricular rate with transient bradycardia and sinus pauses. The patient denies chest pain. He has ruled out for myocardial infarction with negative troponin.  Review of systems complete and found to be negative unless listed above     Past Medical History  Diagnosis Date  . Hypertension   . Coronary artery disease   . Peripheral vascular disease (HCC)   . Edema     Past Surgical History  Procedure Laterality Date  . Stent placement vascular ([facility] hx)    . Cholecystectomy    . Back surgery    . Spinal fusion      Prescriptions prior to admission  Medication Sig Dispense Refill Last Dose  . aspirin EC 81 MG tablet Take 81 mg by mouth daily.   10/15/2015 at 0800  . atorvastatin (LIPITOR) 80 MG tablet Take 80 mg by mouth every evening.   10/15/2015 at Unknown time  . budesonide-formoterol (SYMBICORT) 160-4.5 MCG/ACT inhaler Inhale 2 puffs into the lungs 2 (two) times daily as needed (for shortness of breath).   10/15/2015 at Unknown time  . dofetilide (TIKOSYN) 500 MCG capsule Take 500 mcg by mouth 2 (two) times daily.   10/15/2015 at Unknown time  .  losartan (COZAAR) 100 MG tablet Take 100 mg by mouth daily.   10/15/2015 at Unknown time  . Magnesium 250 MG TABS Take 250 mg by mouth daily.   10/15/2015 at Unknown time  . magnesium oxide (MAG-OX) 400 MG tablet Take 400 mg by mouth every evening.   10/15/2015 at Unknown time  . meclizine (ANTIVERT) 25 MG tablet Take 25 mg by mouth 3 (three) times daily as needed for dizziness.   10/15/2015 at Unknown time  . metoCLOPramide (REGLAN) 10 MG tablet Take 10 mg by mouth 4 (four) times daily as needed for nausea.   10/15/2015 at Unknown time  . Multiple Vitamin (MULTIVITAMIN WITH MINERALS) TABS tablet Take 1 tablet by mouth daily.   10/15/2015 at Unknown time  . Omega-3 Fatty Acids (FISH OIL) 500 MG CAPS Take 500 mg by mouth daily.   10/15/2015 at Unknown time  . omeprazole (PRILOSEC) 20 MG capsule Take 20 mg by mouth daily.   10/15/2015 at Unknown time   Social History   Social History  . Marital Status: Married    Spouse Name: N/A  . Number of Children: N/A  . Years of Education: N/A   Occupational History  . Not on file.   Social History Main Topics  . Smoking status: Current Every Day Smoker -- 1.00 packs/day    Types: Cigarettes  . Smokeless tobacco: Not on file  . Alcohol Use: No  . Drug Use: No  . Sexual Activity: Not on file   Other Topics Concern  . Not on file  Social History Narrative    Family History  Problem Relation Age of Onset  . Heart disease Mother   . Heart disease Father       Review of systems complete and found to be negative unless listed above      PHYSICAL EXAM  General: Well developed, well nourished, in no acute distress HEENT:  Normocephalic and atramatic Neck:  No JVD.  Lungs: Clear bilaterally to auscultation and percussion. Heart: HRRR . Normal S1 and S2 without gallops or murmurs.  Abdomen: Bowel sounds are positive, abdomen soft and non-tender  Msk:  Back normal, normal gait. Normal strength and tone for age. Extremities: No clubbing, cyanosis or  edema.   Neuro: Alert and oriented X 3. Psych:  Good affect, responds appropriately  Labs:   Lab Results  Component Value Date   WBC 6.5 10/16/2015   HGB 13.8 10/16/2015   HCT 40.9 10/16/2015   MCV 92.8 10/16/2015   PLT 185 10/16/2015    Recent Labs Lab 10/15/15 2054 10/16/15 0040  NA 140  --   K 4.2  --   CL 108  --   CO2 26  --   BUN 12  --   CREATININE 0.67 0.64  CALCIUM 9.1  --   GLUCOSE 103*  --    Lab Results  Component Value Date   TROPONINI <0.03 10/16/2015    Lab Results  Component Value Date   CHOL 97 08/25/2015   Lab Results  Component Value Date   HDL 30* 08/25/2015   Lab Results  Component Value Date   LDLCALC 49 08/25/2015   Lab Results  Component Value Date   TRIG 91 08/25/2015   Lab Results  Component Value Date   CHOLHDL 3.2 08/25/2015   No results found for: LDLDIRECT    Radiology: No results found.  EKG: Atrial fibrillation with a rapid ventricular rate  ASSESSMENT AND PLAN:   1. Paroxysmal atrial fibrillation with a rapid ventricular rate, and Tikosyn, not on anticoagulation due to history of significant hematuria 2. Known coronary artery disease, status post stent, currently without chest pain, with negative troponin  Recommendations  1. Continue current medications 2. Defer full dose anticoagulation at this time 3. Defer permanent pacemaker at this time 4. Will discuss with Dr. Gerre Pebbles from [facility] about possible catheter ablation as well as atrial appendage occlusion with Watchman device  Signed: Beanca Kiester MD,PhD, [facility] 10/16/2015, 9:10 AM

## 2015-10-16 NOTE — Progress Notes (Signed)
eLink Physician-Brief Progress Note Patient Name: Shawn RevelsDonald R Lessner DOB: 1951/02/02 MRN: [id number]   Date of Service  10/16/2015  HPI/Events of Note  Call from nurse that patient is having pain from being periodically transcutaneously paced.  No pain meds ordered  eICU Interventions  Plan: Fentanyl 25 to 75 mcg IV q2hours prn pain If this issue continues bedside nurse will need to contact primary attending and or cardiology consulting MDs     Intervention Category Intermediate Interventions: Pain - evaluation and management  DETERDING,ELIZABETH 10/16/2015, 12:39 AM

## 2015-10-16 NOTE — Care Management (Signed)
Many attempts have been made to control patient's heart rate and rhythm over the last few months without success.  Patient presents with tachy brady again.  [facility]  This is being coordinated by Dr Cassie FreerParachos

## 2015-10-16 NOTE — Discharge Summary (Signed)
[facility] [place] [facility]   PATIENT NAME: Shawn Frazier    MR#:  [id number]  DATE OF BIRTH:  04-29-1951  DATE OF ADMISSION:  10/15/2015 ADMITTING PHYSICIAN: Enedina FinnerSona Patel, MD  DATE OF DISCHARGE: 10/16/2015  PRIMARY CARE PHYSICIAN: No primary care provider on file.    ADMISSION DIAGNOSIS:  Bradycardia [R00.1] Tachycardia [R00.0]  DISCHARGE DIAGNOSIS:  Active Problems:   Tachycardia-bradycardia syndrome (HCC)   SECONDARY DIAGNOSIS:   Past Medical History  Diagnosis Date  . Hypertension   . Coronary artery disease   . Peripheral vascular disease (HCC)   . Edema     HOSPITAL COURSE:   65 yo male with PMH of HTN, CAD, PVD, who presented to Hospital due to weakness and feeling lethargic.  #1 tachybradycardia syndrome-patient presented to the hospital initially with A. fib and tachycardia with heart rates in the 110's to 115's.  Patient then developed heart rates in the low 40s. He was admitted to the intensive care unit and external pacemaker pads were placed on the patient. -Patient was seen by cardiology and they recommend catheter ablation and therefore [facility] management and possible catheter ablation electrophysiologic evaluation. -Patient is currently hemodynamically stable and off any drips.  #2 essential hypertension-patient is hemodynamically stable. -He will continue losartan  #3 hyperlipidemia-continue atorvastatin.  #4 COPD - no acute exacerbation and cont. Dulera.   #5. PVD - cont. ASA, Statin.    #6. GERD - cont. Protonix.   DISCHARGE CONDITIONS:   Stable.   CONSULTS OBTAINED:  Treatment Team:  Marcina MillardAlexander Paraschos, MD  DRUG ALLERGIES:   Allergies  Allergen Reactions  . Shellfish Allergy Anaphylaxis  . Codeine Nausea And Vomiting  . Morphine And Related Nausea And Vomiting  . Motrin [Ibuprofen] Nausea And Vomiting  . Xarelto [Rivaroxaban] Other (See  Comments)    Pt states that he urinated blood while on this medication.      DISCHARGE MEDICATIONS:   Current Discharge Medication List    CONTINUE these medications which have NOT CHANGED   Details  aspirin EC 81 MG tablet Take 81 mg by mouth daily.    atorvastatin (LIPITOR) 80 MG tablet Take 80 mg by mouth every evening.    budesonide-formoterol (SYMBICORT) 160-4.5 MCG/ACT inhaler Inhale 2 puffs into the lungs 2 (two) times daily as needed (for shortness of breath).    dofetilide (TIKOSYN) 500 MCG capsule Take 500 mcg by mouth 2 (two) times daily.    losartan (COZAAR) 100 MG tablet Take 100 mg by mouth daily.    Magnesium 250 MG TABS Take 250 mg by mouth daily.    magnesium oxide (MAG-OX) 400 MG tablet Take 400 mg by mouth every evening.    meclizine (ANTIVERT) 25 MG tablet Take 25 mg by mouth 3 (three) times daily as needed for dizziness.    metoCLOPramide (REGLAN) 10 MG tablet Take 10 mg by mouth 4 (four) times daily as needed for nausea.    Multiple Vitamin (MULTIVITAMIN WITH MINERALS) TABS tablet Take 1 tablet by mouth daily.    Omega-3 Fatty Acids (FISH OIL) 500 MG CAPS Take 500 mg by mouth daily.    omeprazole (PRILOSEC) 20 MG capsule Take 20 mg by mouth daily.         DISCHARGE INSTRUCTIONS:   DIET:  Cardiac diet  DISCHARGE CONDITION:  Stable  ACTIVITY:  Activity as tolerated  OXYGEN:  Home Oxygen: No.   Oxygen Delivery: room air  DISCHARGE LOCATION:  [facility].     If you experience worsening of your admission symptoms, develop shortness of breath, life threatening emergency, suicidal or homicidal thoughts you must seek medical attention immediately by calling 911 or calling your MD immediately  if symptoms less severe.  You Must read complete instructions/literature along with all the possible adverse reactions/side effects for all the Medicines you take and that have been prescribed to you. Take any new Medicines after you have  completely understood and accpet all the possible adverse reactions/side effects.   Please note  You were cared for by a hospitalist during your hospital stay. If you have any questions about your discharge medications or the care you received while you were in the hospital after you are discharged, you can call the unit and asked to speak with the hospitalist on call if the hospitalist that took care of you is not available. Once you are discharged, your primary care physician will handle any further medical issues. Please note that NO REFILLS for any discharge medications will be authorized once you are discharged, as it is imperative that you return to your primary care physician (or establish a relationship with a primary care physician if you do not have one) for your aftercare needs so that they can reassess your need for medications and monitor your lab values.     Today   HR Stable.  No chest pain, N/V.  No abdominal pain.    VITAL SIGNS:  Blood pressure 113/68, pulse 59, temperature 97.8 F (36.6 C), temperature source Oral, resp. rate 14, height  (1.88 m), weight 96.163 kg (212 lb), SpO2 98 %.  I/O:   Intake/Output Summary (Last 24 hours) at 10/16/15 1142 Last data filed at 10/16/15 1000  Gross per 24 hour  Intake 828.33 ml  Output    400 ml  Net 428.33 ml    PHYSICAL EXAMINATION:  GENERAL:  65 y.o.-year-old patient lying in the bed with no acute distress.  EYES: Pupils equal, round, reactive to light and accommodation. No scleral icterus. Extraocular muscles intact.  HEENT: Head atraumatic, normocephalic. Oropharynx and nasopharynx clear.  NECK:  Supple, no jugular venous distention. No thyroid enlargement, no tenderness.  LUNGS: Normal breath sounds bilaterally, no wheezing, rales,rhonchi. No use of accessory muscles of respiration.  CARDIOVASCULAR: S1, S2 normal. No murmurs, rubs, or gallops.  ABDOMEN: Soft, non-tender, non-distended. Bowel sounds present. No  organomegaly or mass.  EXTREMITIES: No pedal edema, cyanosis, or clubbing.  NEUROLOGIC: Cranial nerves II through XII are intact. No focal motor or sensory defecits b/l.  PSYCHIATRIC: The patient is alert and oriented x 3. Good affect.  SKIN: No obvious rash, lesion, or ulcer.   DATA REVIEW:   CBC  Recent Labs Lab 10/16/15 0040  WBC 6.5  HGB 13.8  HCT 40.9  PLT 185    Chemistries   Recent Labs Lab 10/15/15 2054 10/16/15 0040  NA 140  --   K 4.2  --   CL 108  --   CO2 26  --   GLUCOSE 103*  --   BUN 12  --   CREATININE 0.67 0.64  CALCIUM 9.1  --   MG 1.8  --     Cardiac Enzymes  Recent Labs Lab 10/16/15 0425  TROPONINI <0.03    Microbiology Results  Results for orders placed or performed during the hospital encounter of 10/15/15  MRSA PCR Screening     Status: None   Collection Time:  10/16/15 12:20 AM  Result Value Ref Range Status   MRSA by PCR NEGATIVE NEGATIVE Final    Comment:        The GeneXpert MRSA Assay (FDA approved for NASAL specimens only), is one component of a comprehensive MRSA colonization surveillance program. It is not intended to diagnose MRSA infection nor to guide or monitor treatment for MRSA infections.     RADIOLOGY:  No results found.    Management plans discussed with the patient, family and they are in agreement.  CODE STATUS:     Code Status Orders        Start     Ordered   10/16/15 0016  Full code   Continuous     10/16/15 0015    Code Status History    Date Active Date Inactive Code Status Order ID Comments User Context   08/24/2015  9:18 PM 08/25/2015  4:59 PM Full Code [id number]  Alford Highland, MD ED      TOTAL TIME TAKING CARE OF THIS PATIENT: 40 minutes.    Houston Siren M.D on 10/16/2015 at 11:42 AM  Between 7am to 6pm - Pager - [phone]  After 6pm go to [email or web address] - password [email or web address] [facility]  [facility]  Office  [phone]  CC: Primary care physician; No primary  care provider on file.

## 2015-10-16 NOTE — Progress Notes (Signed)
Patient left with [facility]. Had been transitioned to room air, no complaints of pain, still in sinus rhythm. [facility]manda [facility] and [facility] and patient's family with ETA.
